# Patient Record
Sex: Female | Born: 1965 | Race: White | Hispanic: No | Marital: Married | Smoking: Never smoker
Health system: Southern US, Community
[De-identification: ages and names within clinical notes are randomized; demographics above are authoritative.]

## PROBLEM LIST (undated history)

## (undated) DIAGNOSIS — C801 Malignant (primary) neoplasm, unspecified: Secondary | ICD-10-CM

## (undated) DIAGNOSIS — D6851 Activated protein C resistance: Secondary | ICD-10-CM

## (undated) DIAGNOSIS — D689 Coagulation defect, unspecified: Secondary | ICD-10-CM

## (undated) DIAGNOSIS — T7840XA Allergy, unspecified, initial encounter: Secondary | ICD-10-CM

## (undated) DIAGNOSIS — Z86718 Personal history of other venous thrombosis and embolism: Secondary | ICD-10-CM

## (undated) HISTORY — PX: OTHER SURGICAL HISTORY: SHX169

## (undated) HISTORY — DX: Coagulation defect, unspecified: D68.9

## (undated) HISTORY — DX: Activated protein C resistance: D68.51

## (undated) HISTORY — DX: Personal history of other venous thrombosis and embolism: Z86.718

## (undated) HISTORY — DX: Allergy, unspecified, initial encounter: T78.40XA

## (undated) HISTORY — PX: COLONOSCOPY: SHX174

## (undated) HISTORY — DX: Malignant (primary) neoplasm, unspecified: C80.1

## (undated) HISTORY — PX: BREAST CYST ASPIRATION: SHX578

---

## 1998-11-09 ENCOUNTER — Encounter: Payer: Self-pay | Admitting: Obstetrics and Gynecology

## 1998-11-09 ENCOUNTER — Ambulatory Visit (HOSPITAL_COMMUNITY): Admission: RE | Admit: 1998-11-09 | Discharge: 1998-11-09 | Payer: Self-pay | Admitting: Obstetrics and Gynecology

## 2000-01-11 ENCOUNTER — Encounter: Payer: Self-pay | Admitting: Obstetrics and Gynecology

## 2000-01-11 ENCOUNTER — Ambulatory Visit (HOSPITAL_COMMUNITY): Admission: RE | Admit: 2000-01-11 | Discharge: 2000-01-11 | Payer: Self-pay | Admitting: Obstetrics and Gynecology

## 2000-02-14 ENCOUNTER — Other Ambulatory Visit: Admission: RE | Admit: 2000-02-14 | Discharge: 2000-02-14 | Payer: Self-pay | Admitting: Obstetrics and Gynecology

## 2001-02-13 ENCOUNTER — Other Ambulatory Visit: Admission: RE | Admit: 2001-02-13 | Discharge: 2001-02-13 | Payer: Self-pay | Admitting: Obstetrics and Gynecology

## 2001-02-20 ENCOUNTER — Encounter: Admission: RE | Admit: 2001-02-20 | Discharge: 2001-02-20 | Payer: Self-pay | Admitting: Obstetrics and Gynecology

## 2001-02-20 ENCOUNTER — Encounter: Payer: Self-pay | Admitting: Obstetrics and Gynecology

## 2001-07-17 ENCOUNTER — Other Ambulatory Visit: Admission: RE | Admit: 2001-07-17 | Discharge: 2001-07-17 | Payer: Self-pay | Admitting: Obstetrics and Gynecology

## 2004-06-01 ENCOUNTER — Encounter: Admission: RE | Admit: 2004-06-01 | Discharge: 2004-06-01 | Payer: Self-pay | Admitting: Obstetrics and Gynecology

## 2004-12-12 ENCOUNTER — Ambulatory Visit: Payer: Self-pay | Admitting: Internal Medicine

## 2005-09-10 ENCOUNTER — Ambulatory Visit: Payer: Self-pay | Admitting: Internal Medicine

## 2005-09-14 ENCOUNTER — Ambulatory Visit: Admission: RE | Admit: 2005-09-14 | Discharge: 2005-09-14 | Payer: Self-pay | Admitting: Internal Medicine

## 2005-09-24 ENCOUNTER — Ambulatory Visit: Payer: Self-pay | Admitting: Internal Medicine

## 2006-06-04 ENCOUNTER — Encounter: Admission: RE | Admit: 2006-06-04 | Discharge: 2006-06-04 | Payer: Self-pay | Admitting: Obstetrics and Gynecology

## 2006-06-27 ENCOUNTER — Encounter: Admission: RE | Admit: 2006-06-27 | Discharge: 2006-06-27 | Payer: Self-pay | Admitting: Obstetrics and Gynecology

## 2007-03-20 ENCOUNTER — Ambulatory Visit: Payer: Self-pay | Admitting: Internal Medicine

## 2007-03-20 LAB — CONVERTED CEMR LAB
ALT: 12 units/L (ref 0–40)
Albumin: 3.8 g/dL (ref 3.5–5.2)
Alkaline Phosphatase: 59 units/L (ref 39–117)
Basophils Relative: 0.2 % (ref 0.0–1.0)
Bilirubin Urine: NEGATIVE
Calcium: 8.8 mg/dL (ref 8.4–10.5)
Chloride: 109 meq/L (ref 96–112)
Cholesterol: 135 mg/dL (ref 0–200)
Creatinine, Ser: 0.7 mg/dL (ref 0.4–1.2)
Eosinophils Relative: 1.7 % (ref 0.0–5.0)
GFR calc Af Amer: 119 mL/min
GFR calc non Af Amer: 98 mL/min
Glucose, Bld: 104 mg/dL — ABNORMAL HIGH (ref 70–99)
HCT: 38.1 % (ref 36.0–46.0)
Hemoglobin, Urine: NEGATIVE
Hemoglobin: 13 g/dL (ref 12.0–15.0)
Lymphocytes Relative: 23.8 % (ref 12.0–46.0)
Neutro Abs: 3.7 10*3/uL (ref 1.4–7.7)
Neutrophils Relative %: 66.8 % (ref 43.0–77.0)
Nitrite: NEGATIVE
RDW: 12.1 % (ref 11.5–14.6)
Specific Gravity, Urine: 1.025 (ref 1.000–1.03)
Urine Glucose: NEGATIVE mg/dL

## 2007-03-28 ENCOUNTER — Ambulatory Visit: Payer: Self-pay | Admitting: Internal Medicine

## 2007-05-13 ENCOUNTER — Ambulatory Visit: Payer: Self-pay | Admitting: Internal Medicine

## 2007-06-06 ENCOUNTER — Encounter: Admission: RE | Admit: 2007-06-06 | Discharge: 2007-06-06 | Payer: Self-pay | Admitting: Obstetrics and Gynecology

## 2007-06-12 ENCOUNTER — Encounter: Admission: RE | Admit: 2007-06-12 | Discharge: 2007-06-12 | Payer: Self-pay | Admitting: Obstetrics and Gynecology

## 2007-08-09 ENCOUNTER — Encounter: Payer: Self-pay | Admitting: Internal Medicine

## 2007-08-09 DIAGNOSIS — D6859 Other primary thrombophilia: Secondary | ICD-10-CM

## 2007-08-09 DIAGNOSIS — Z86718 Personal history of other venous thrombosis and embolism: Secondary | ICD-10-CM

## 2007-08-09 DIAGNOSIS — J309 Allergic rhinitis, unspecified: Secondary | ICD-10-CM | POA: Insufficient documentation

## 2007-12-09 ENCOUNTER — Ambulatory Visit: Payer: Self-pay | Admitting: Internal Medicine

## 2007-12-09 DIAGNOSIS — J019 Acute sinusitis, unspecified: Secondary | ICD-10-CM | POA: Insufficient documentation

## 2008-01-14 ENCOUNTER — Ambulatory Visit: Payer: Self-pay | Admitting: Internal Medicine

## 2008-01-14 DIAGNOSIS — J329 Chronic sinusitis, unspecified: Secondary | ICD-10-CM | POA: Insufficient documentation

## 2008-06-07 ENCOUNTER — Encounter: Admission: RE | Admit: 2008-06-07 | Discharge: 2008-06-07 | Payer: Self-pay | Admitting: Obstetrics and Gynecology

## 2008-06-28 ENCOUNTER — Ambulatory Visit: Payer: Self-pay | Admitting: Internal Medicine

## 2008-06-28 DIAGNOSIS — F329 Major depressive disorder, single episode, unspecified: Secondary | ICD-10-CM

## 2008-06-28 DIAGNOSIS — R4589 Other symptoms and signs involving emotional state: Secondary | ICD-10-CM | POA: Insufficient documentation

## 2008-06-28 DIAGNOSIS — R7309 Other abnormal glucose: Secondary | ICD-10-CM | POA: Insufficient documentation

## 2008-06-30 LAB — CONVERTED CEMR LAB
Basophils Relative: 1.9 % (ref 0.0–3.0)
CO2: 28 meq/L (ref 19–32)
Chloride: 108 meq/L (ref 96–112)
Creatinine, Ser: 0.8 mg/dL (ref 0.4–1.2)
Eosinophils Absolute: 0.3 10*3/uL (ref 0.0–0.7)
Glucose, Bld: 118 mg/dL — ABNORMAL HIGH (ref 70–99)
Hemoglobin: 12.1 g/dL (ref 12.0–15.0)
MCHC: 34.6 g/dL (ref 30.0–36.0)
Monocytes Absolute: 0.5 10*3/uL (ref 0.1–1.0)
Monocytes Relative: 5.6 % (ref 3.0–12.0)
Neutro Abs: 6.3 10*3/uL (ref 1.4–7.7)
Neutrophils Relative %: 73.1 % (ref 43.0–77.0)
Platelets: 219 10*3/uL (ref 150–400)
RDW: 11.9 % (ref 11.5–14.6)
Sodium: 140 meq/L (ref 135–145)

## 2008-08-30 ENCOUNTER — Ambulatory Visit: Payer: Self-pay | Admitting: Internal Medicine

## 2008-12-22 ENCOUNTER — Ambulatory Visit: Payer: Self-pay | Admitting: Internal Medicine

## 2008-12-22 LAB — CONVERTED CEMR LAB
BUN: 11 mg/dL (ref 6–23)
GFR calc Af Amer: 117 mL/min
Hgb A1c MFr Bld: 5.9 % (ref 4.6–6.0)
TSH: 0.58 microintl units/mL (ref 0.35–5.50)

## 2008-12-28 ENCOUNTER — Ambulatory Visit: Payer: Self-pay | Admitting: Internal Medicine

## 2009-06-17 ENCOUNTER — Ambulatory Visit: Payer: Self-pay | Admitting: Internal Medicine

## 2009-06-17 LAB — CONVERTED CEMR LAB
AST: 15 units/L (ref 0–37)
Basophils Absolute: 0 10*3/uL (ref 0.0–0.1)
Calcium: 8.8 mg/dL (ref 8.4–10.5)
Chloride: 106 meq/L (ref 96–112)
Cholesterol: 160 mg/dL (ref 0–200)
GFR calc non Af Amer: 96.82 mL/min (ref >60)
HCT: 37.7 % (ref 36.0–46.0)
Hgb A1c MFr Bld: 5.8 % (ref 4.6–6.5)
Lymphocytes Relative: 30.8 % (ref 12.0–46.0)
MCHC: 33.9 g/dL (ref 30.0–36.0)
MCV: 96.1 fL (ref 78.0–100.0)
Monocytes Relative: 9.6 % (ref 3.0–12.0)
Neutro Abs: 2.2 10*3/uL (ref 1.4–7.7)
Neutrophils Relative %: 51.7 % (ref 43.0–77.0)
Potassium: 4.4 meq/L (ref 3.5–5.1)
RBC: 3.92 M/uL (ref 3.87–5.11)
Sodium: 138 meq/L (ref 135–145)
Total CHOL/HDL Ratio: 3
Total Protein: 6.5 g/dL (ref 6.0–8.3)
Triglycerides: 47 mg/dL (ref 0.0–149.0)
VLDL: 9.4 mg/dL (ref 0.0–40.0)

## 2009-06-20 ENCOUNTER — Encounter: Admission: RE | Admit: 2009-06-20 | Discharge: 2009-06-20 | Payer: Self-pay | Admitting: Obstetrics and Gynecology

## 2009-06-21 ENCOUNTER — Ambulatory Visit: Payer: Self-pay | Admitting: Internal Medicine

## 2009-06-28 ENCOUNTER — Encounter: Admission: RE | Admit: 2009-06-28 | Discharge: 2009-06-28 | Payer: Self-pay | Admitting: Obstetrics and Gynecology

## 2009-07-27 ENCOUNTER — Ambulatory Visit: Payer: Self-pay | Admitting: Internal Medicine

## 2009-07-27 DIAGNOSIS — R21 Rash and other nonspecific skin eruption: Secondary | ICD-10-CM | POA: Insufficient documentation

## 2009-10-14 ENCOUNTER — Ambulatory Visit: Payer: Self-pay | Admitting: Internal Medicine

## 2009-12-07 ENCOUNTER — Ambulatory Visit: Payer: Self-pay | Admitting: Internal Medicine

## 2009-12-07 DIAGNOSIS — I8000 Phlebitis and thrombophlebitis of superficial vessels of unspecified lower extremity: Secondary | ICD-10-CM | POA: Insufficient documentation

## 2009-12-07 DIAGNOSIS — D682 Hereditary deficiency of other clotting factors: Secondary | ICD-10-CM | POA: Insufficient documentation

## 2010-01-13 ENCOUNTER — Ambulatory Visit: Payer: Self-pay | Admitting: Internal Medicine

## 2010-01-13 DIAGNOSIS — J209 Acute bronchitis, unspecified: Secondary | ICD-10-CM | POA: Insufficient documentation

## 2010-02-20 ENCOUNTER — Encounter: Payer: Self-pay | Admitting: Internal Medicine

## 2010-02-20 ENCOUNTER — Ambulatory Visit: Payer: Self-pay | Admitting: Endocrinology

## 2010-02-20 DIAGNOSIS — J069 Acute upper respiratory infection, unspecified: Secondary | ICD-10-CM | POA: Insufficient documentation

## 2010-03-09 IMAGING — US US BREAST R
1 series · 5 of 5 positions shown · non-contrast
Comparison: 06/20/2009, 06/07/2008, and 06/06/2007

CLINICAL DATA: Abnormal screening mammogram with possible right
breast mass.

DIGITAL DIAGNOSTIC  RIGHT  MAMMOGRAM   AND RIGHT BREAST
ULTRASOUND:

[Series 1: us breast right · 5 of 5 slices shown]
[im 1/5]
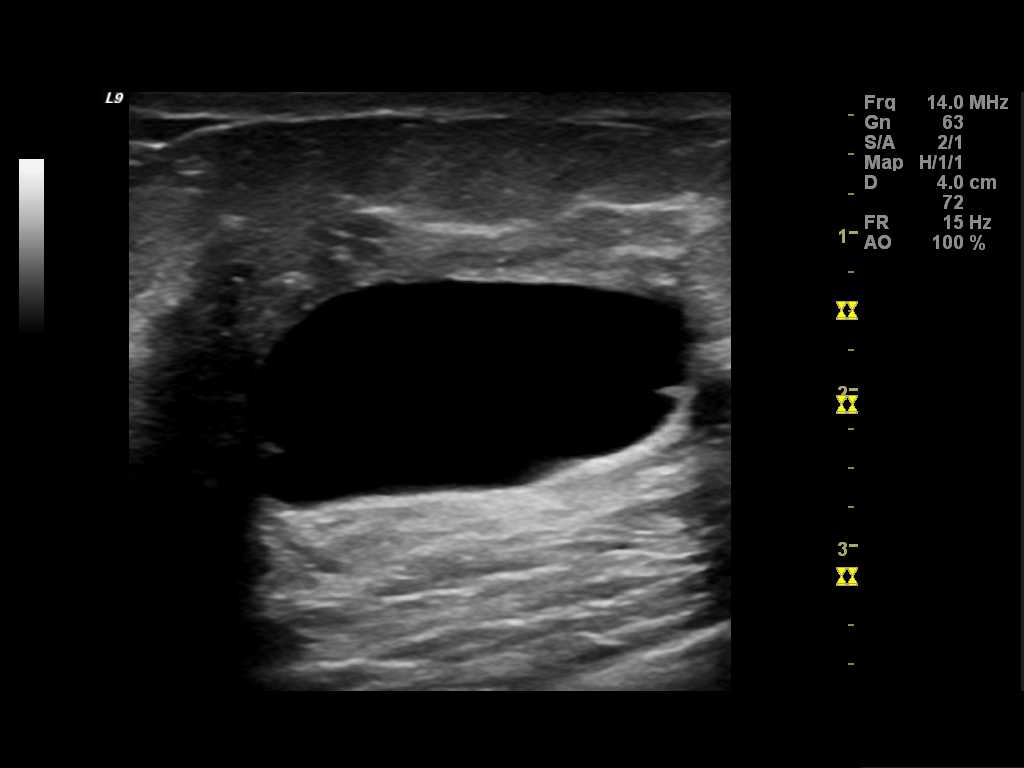
[im 2/5]
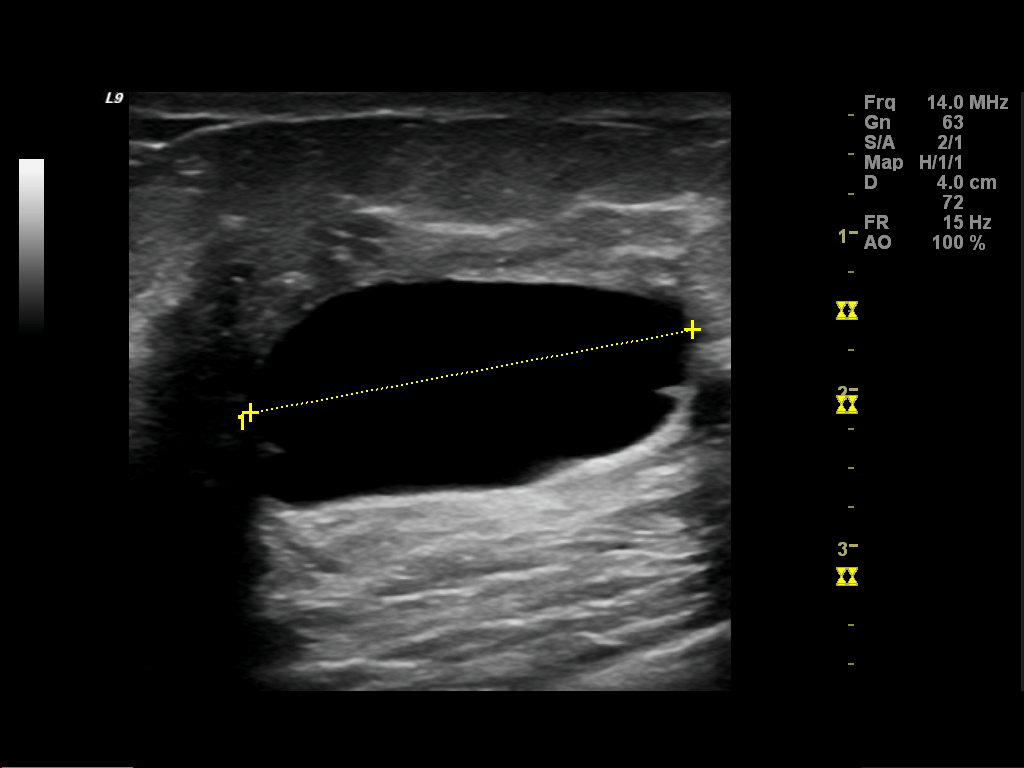
[im 3/5]
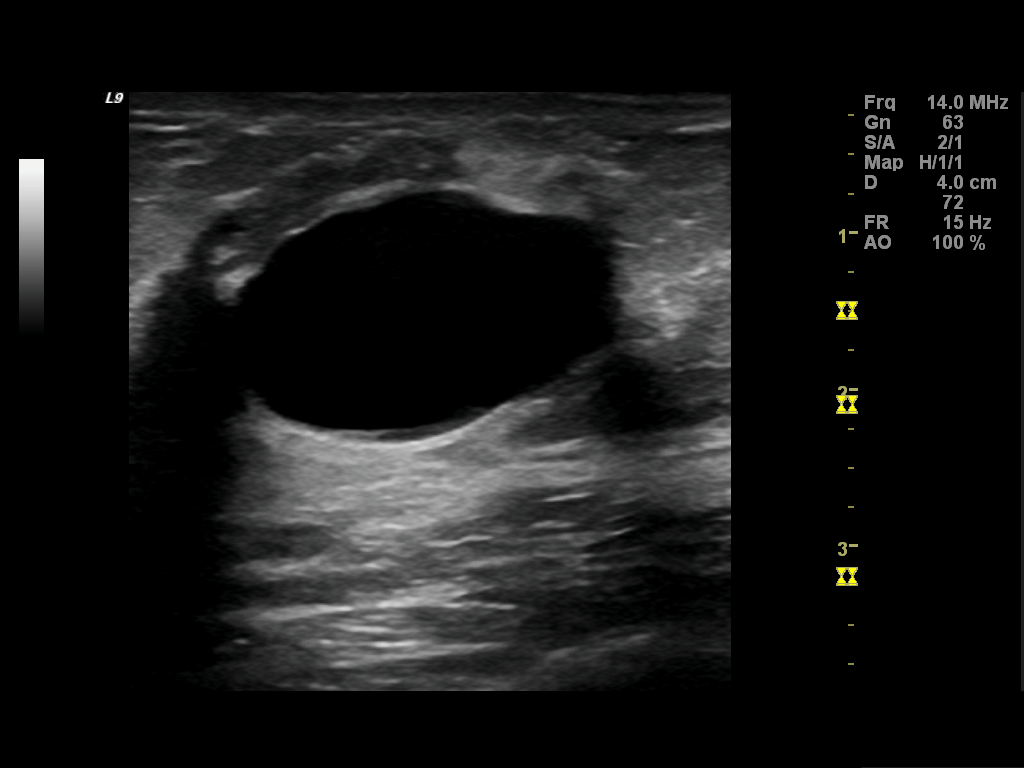
[im 4/5]
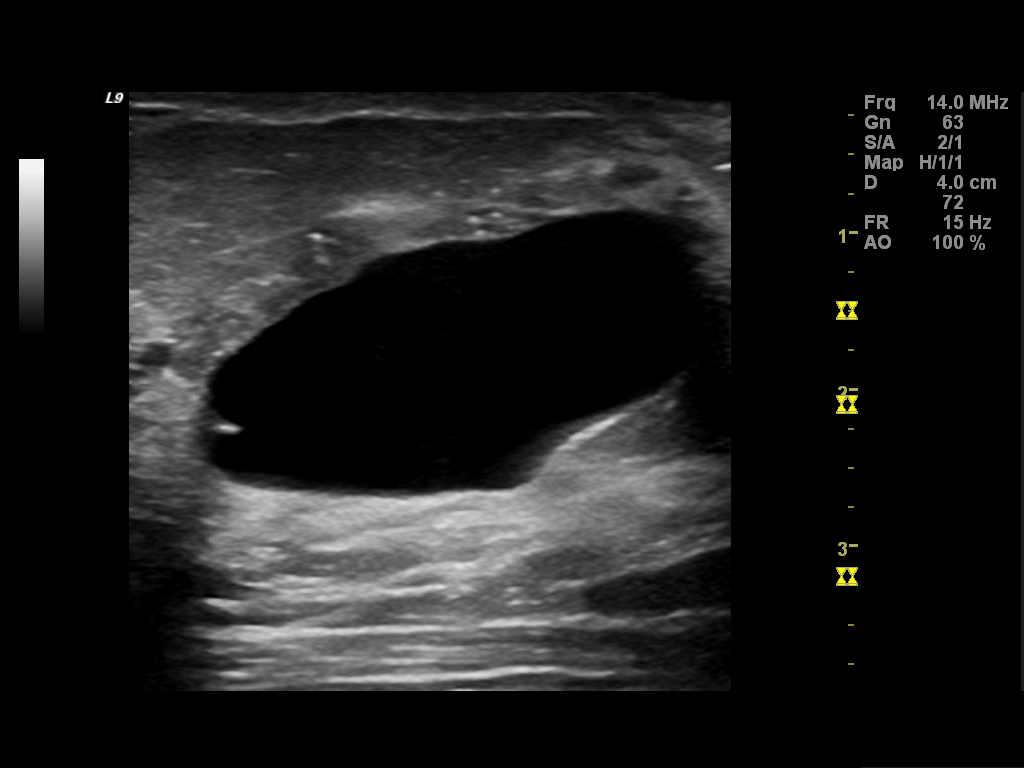
[im 5/5]
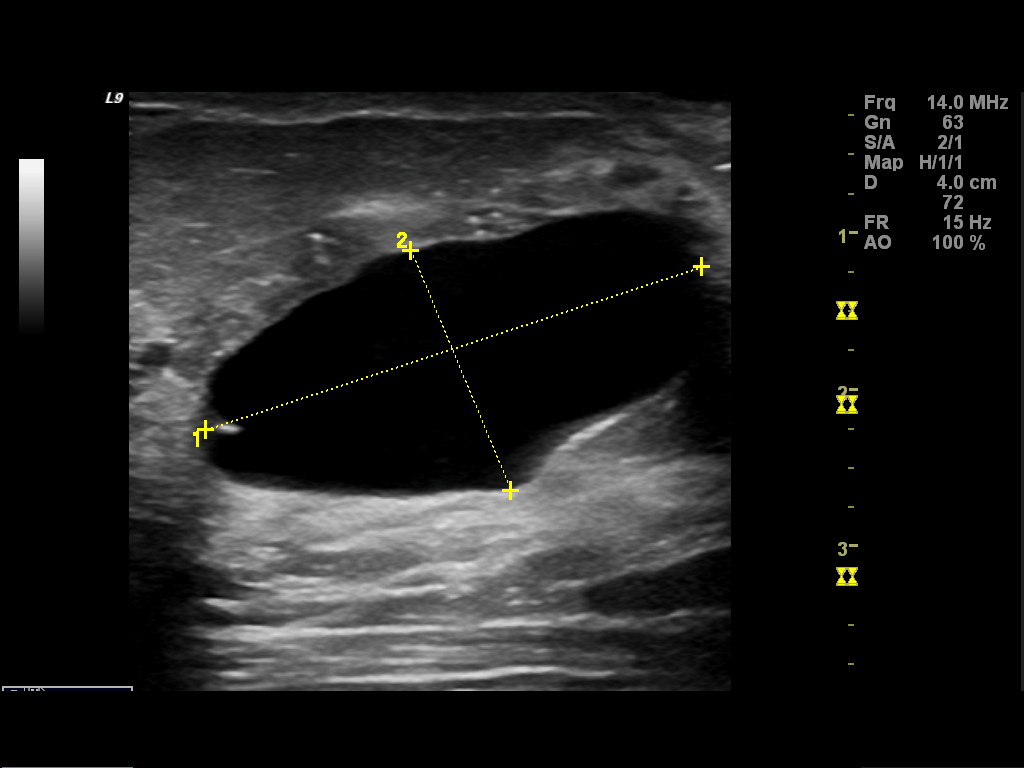

[5 of 5 positions shown; findings below may reference images not displayed]

FINDINGS: CC and MLO spot compression views of the subareolar right
breast demonstrates a circumscribed oval mass in the subareolar
region without distortion or suspicious calcifications.

On physical exam, a soft mobile mass is identified in the right
subareolar region.

Ultrasound is performed, showing a 3.3 x 1.6 x 2.8 cm simple cyst
in the right subareolar region.
IMPRESSION: Simple cyst in the right subareolar region corresponding to
screening mammogram abnormality.

These findings were discussed with the patient.  She was encouraged
to continue monthly self exams and to contact her primary physician
if any changes noted.

BI-RADS CATEGORY 2:  Benign finding(s).

Recommend bilateral screening mammograms in 1 year.

## 2010-03-16 ENCOUNTER — Ambulatory Visit: Payer: Self-pay | Admitting: Internal Medicine

## 2010-03-16 DIAGNOSIS — M25569 Pain in unspecified knee: Secondary | ICD-10-CM

## 2010-03-30 ENCOUNTER — Ambulatory Visit: Payer: Self-pay | Admitting: Internal Medicine

## 2010-06-22 ENCOUNTER — Encounter: Admission: RE | Admit: 2010-06-22 | Discharge: 2010-06-22 | Payer: Self-pay | Admitting: Obstetrics and Gynecology

## 2010-06-29 ENCOUNTER — Ambulatory Visit: Payer: Self-pay | Admitting: Internal Medicine

## 2010-12-25 ENCOUNTER — Other Ambulatory Visit: Payer: Self-pay | Admitting: Internal Medicine

## 2010-12-25 ENCOUNTER — Ambulatory Visit
Admission: RE | Admit: 2010-12-25 | Discharge: 2010-12-25 | Payer: Self-pay | Source: Home / Self Care | Attending: Internal Medicine | Admitting: Internal Medicine

## 2010-12-25 LAB — BASIC METABOLIC PANEL
BUN: 15 mg/dL (ref 6–23)
CO2: 24 mEq/L (ref 19–32)
Calcium: 8.7 mg/dL (ref 8.4–10.5)
Chloride: 103 mEq/L (ref 96–112)
Creatinine, Ser: 0.6 mg/dL (ref 0.4–1.2)
GFR: 108.58 mL/min (ref >60.00)
Glucose, Bld: 91 mg/dL (ref 70–99)
Potassium: 4.6 mEq/L (ref 3.5–5.1)
Sodium: 133 mEq/L — ABNORMAL LOW (ref 135–145)

## 2010-12-25 LAB — LIPID PANEL
Cholesterol: 160 mg/dL (ref 0–200)
HDL: 67.8 mg/dL (ref >39.00)
LDL Cholesterol: 81 mg/dL (ref 0–99)
Total CHOL/HDL Ratio: 2
Triglycerides: 54 mg/dL (ref 0.0–149.0)
VLDL: 10.8 mg/dL (ref 0.0–40.0)

## 2010-12-25 LAB — CBC WITH DIFFERENTIAL/PLATELET
Basophils Absolute: 0 10*3/uL (ref 0.0–0.1)
Basophils Relative: 0.2 % (ref 0.0–3.0)
Eosinophils Absolute: 0.3 10*3/uL (ref 0.0–0.7)
Eosinophils Relative: 6.6 % — ABNORMAL HIGH (ref 0.0–5.0)
HCT: 38.4 % (ref 36.0–46.0)
Hemoglobin: 12.9 g/dL (ref 12.0–15.0)
Lymphocytes Relative: 28.9 % (ref 12.0–46.0)
Lymphs Abs: 1.5 10*3/uL (ref 0.7–4.0)
MCHC: 33.5 g/dL (ref 30.0–36.0)
MCV: 96.2 fl (ref 78.0–100.0)
Monocytes Absolute: 0.5 10*3/uL (ref 0.1–1.0)
Monocytes Relative: 9.4 % (ref 3.0–12.0)
Neutro Abs: 2.8 10*3/uL (ref 1.4–7.7)
Neutrophils Relative %: 54.9 % (ref 43.0–77.0)
Platelets: 233 10*3/uL (ref 150.0–400.0)
RBC: 3.99 Mil/uL (ref 3.87–5.11)
RDW: 13 % (ref 11.5–14.6)
WBC: 5.1 10*3/uL (ref 4.5–10.5)

## 2010-12-25 LAB — HEPATIC FUNCTION PANEL
ALT: 13 U/L (ref 0–35)
AST: 13 U/L (ref 0–37)
Albumin: 3.8 g/dL (ref 3.5–5.2)
Alkaline Phosphatase: 60 U/L (ref 39–117)
Bilirubin, Direct: 0.1 mg/dL (ref 0.0–0.3)
Total Bilirubin: 0.7 mg/dL (ref 0.3–1.2)
Total Protein: 6.3 g/dL (ref 6.0–8.3)

## 2010-12-25 LAB — URINALYSIS
Bilirubin Urine: NEGATIVE
Hemoglobin, Urine: NEGATIVE
Ketones, ur: NEGATIVE
Leukocytes, UA: NEGATIVE
Nitrite: NEGATIVE
Specific Gravity, Urine: 1.025 (ref 1.000–1.030)
Total Protein, Urine: NEGATIVE
Urine Glucose: NEGATIVE
Urobilinogen, UA: 0.2 (ref 0.0–1.0)
pH: 5.5 (ref 5.0–8.0)

## 2010-12-25 LAB — TSH: TSH: 2 u[IU]/mL (ref 0.35–5.50)

## 2011-01-02 ENCOUNTER — Ambulatory Visit
Admission: RE | Admit: 2011-01-02 | Discharge: 2011-01-02 | Payer: Self-pay | Source: Home / Self Care | Attending: Internal Medicine | Admitting: Internal Medicine

## 2011-01-02 ENCOUNTER — Encounter: Payer: Self-pay | Admitting: Internal Medicine

## 2011-01-11 NOTE — Assessment & Plan Note (Signed)
Summary: FELL/ TWISTED KNEE/AVP'S PT/NWS   Vital Signs:  Patient profile:   45 year old female Height:      64 inches Weight:      155 pounds BMI:     26.70 O2 Sat:      97 % on Room air Temp:     97.5 degrees F oral Pulse rate:   85 / minute BP sitting:   110 / 70  (left arm) Cuff size:   regular  Vitals Entered ByZella Ball Ewing (March 16, 2010 4:26 PM)  O2 Flow:  Room air CC: Fell twisted right knee/RE   CC:  Fell twisted right knee/RE.  History of Present Illness: here after falling off of a short stool at work to the right , fell about 1-2 ft reaching for an object overhead, fell to the right without injury or contusion to anything else  but realized at the time her right knee was twisted but minimal pain at the time it happened this am;  later during the day has exeperienced relatively localized pain to the prox patellar region and medial knee, as well a localized swelling to the medial knee as well with a sense of tightness;    no other sweling or pain or bruising noted. No fever;  pain approx 5/10, sharp, localized, without click or catch to the knee;  no prior hx of knee injury or surgury in the past.    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 1 By Mouth Daily 2)  Fluoxetine Hcl 10 Mg  Caps (Fluoxetine Hcl) .... Take 1 Capsule By Mouth Daily 3)   Lorazepam 1 Mg  Tabs (Lorazepam) .... 1/2 - 1 By Mouth Bid Prn 4)  Zyrtec Hives Relief 10 Mg Tabs (Cetirizine Hcl) .... As Needed 5)  Tessalon Perles 100 Mg Caps (Benzonatate) .Marland Kitchen.. 1-2 By Mouth Two Times A Day As Needed Cogh 6)  Promethazine-Codeine 6.25-10 Mg/95ml Syrp (Promethazine-Codeine) .... 5-10 Ml By Mouth Q Id As Needed Cough 7)  Cefuroxime Axetil 250 Mg Tabs (Cefuroxime Axetil) .Marland Kitchen.. 1 Two Times A Day  Current Medications (verified): 1)  Vitamin D3 1000 Unit  Tabs (Cholecalciferol) .Marland Kitchen.. 1 By Mouth Daily 2)  Fluoxetine Hcl 10 Mg  Caps (Fluoxetine Hcl) .... Take 1 Capsule By Mouth Daily 3)  Lorazepam 1 Mg  Tabs (Lorazepam) .... 1/2 - 1 By Mouth Bid Prn 4)  Zyrtec Hives Relief 10 Mg Tabs (Cetirizine Hcl) .... As Needed 5)  Naproxen 500 Mg Tabs (Naproxen) .Marland Kitchen.. 1po Two Times A Day As Needed  Allergies (verified): No Known Drug Allergies  Past History:  Past Medical History: Last updated: 01/14/2008 Allergic rhinitis DVT, hx of History of factor V leyden mutation/hypercoagulable state  Past Surgical History: Last updated: 12/09/2007 Denies surgical history  Social History: Last updated: 12/09/2007 Never Smoked Alcohol use-yes  Risk Factors: Smoking Status: never (01/13/2010)  Review of Systems       all otherwise negative per pt -    Physical Exam  General:  alert and well-developed.   Head:  normocephalic and atraumatic.   Eyes:  vision grossly intact, pupils equal, and pupils round.   Ears:  R ear normal and L ear normal.   Nose:  no external deformity and no nasal discharge.   Mouth:  no gingival abnormalities and pharynx pink and moist.   Neck:  supple and no masses.   Lungs:  normal respiratory effort and normal breath sounds.   Heart:  normal rate and regular rhythm.   Msk:  right knee with medial swelling and tenderness over the joint line; has FROM and o/w nontender and nonswollen,  no click , catch or crepitus noted o/w normal appearing knee Extremities:   no edema, no erythema  Neurologic:  strength normal in all extremities.     Impression & Recommendations:  Problem # 1:  KNEE PAIN, RIGHT, ACUTE (ICD-719.46)  c/w med collateral ligement strain most likely - for ice three times a day 15 min, 2 days only;  for film today to r/o unusual fx, nsaid as needed, f/u as needed , consdier ortho if pesists or worsens   Orders: T-Knee Right 2 view (73560TC)  Her updated medication list for this problem includes:    Naproxen 500 Mg Tabs (Naproxen) .Marland Kitchen... 1po two times a day as needed  Complete Medication List: 1)  Vitamin D3 1000 Unit Tabs (Cholecalciferol) .Marland Kitchen.. 1 by mouth daily 2)  Fluoxetine Hcl 10 Mg Caps (Fluoxetine hcl) .... Take 1 capsule by mouth daily 3)  Lorazepam 1 Mg Tabs (Lorazepam) .... 1/2 - 1 by mouth bid prn 4)  Zyrtec Hives Relief 10 Mg Tabs (Cetirizine hcl) .... As needed 5)  Naproxen 500 Mg Tabs (Naproxen) .Marland Kitchen.. 1po two times a day as needed  Patient Instructions: 1)  Please take all new medications as prescribed 2)  Continue all previous medications as before this visit  3)  Please go to Radiology in the basement level for your X-Ray today  4)  Please schedule an appointment with your primary doctor  as needed Prescriptions: NAPROXEN 500 MG TABS (NAPROXEN) 1po two times a day as needed  #60 x 0   Entered and Authorized by:   Corwin Levins MD   Signed by:   Corwin Levins MD on 03/16/2010   Method used:   Print then Give to Patient   RxID:   [id number]

## 2011-01-11 NOTE — Assessment & Plan Note (Signed)
Summary: SINUS INFECTION/SEVERE COUGH/PLOT/LB   Vital Signs:  Patient profile:   45 year old female Height:      64 inches (162.56 cm) Weight:      155.50 pounds (70.68 kg) O2 Sat:      98 % on Room air Temp:     97.1 degrees F (36.17 degrees C) oral Pulse rate:   85 / minute BP sitting:   110 / 68  (left arm) Cuff size:   regular  Vitals Entered By: Josph Macho RMA (February 20, 2010 3:39 PM)  O2 Flow:  Room air CC: Sinus infection, cough X5 days/ pt states she is no longer taking Tessalon or Promethazine/ CF Is Patient Diabetic? No   CC:  Sinus infection and cough X5 days/ pt states she is no longer taking Tessalon or Promethazine/ CF.  History of Present Illness: pt states 4 days of nasal congestion, hoarseness, and dry cough.  Current Medications (verified): 1)  Vitamin D3 1000 Unit  Tabs (Cholecalciferol) .Marland Kitchen.. 1 By Mouth Daily 2)  Fluoxetine Hcl 10 Mg  Caps (Fluoxetine Hcl) .... Take 1 Capsule By Mouth Daily 3)  Lorazepam 1 Mg  Tabs (Lorazepam) .... 1/2 - 1 By Mouth Bid Prn 4)  Zyrtec Hives Relief 10 Mg Tabs (Cetirizine Hcl) .... As Needed 5)  Tessalon Perles 100 Mg Caps (Benzonatate) .Marland Kitchen.. 1-2 By Mouth Two Times A Day As Needed Cogh 6)  Promethazine-Codeine 6.25-10 Mg/70ml Syrp (Promethazine-Codeine) .... 5-10 Ml By Mouth Q Id As Needed Cough 7)  Diflucan 150 Mg Tabs (Fluconazole) .Marland Kitchen.. 1 By Mouth Once Daily Once For Yeast Infection  Allergies (verified): No Known Drug Allergies  Past History:  Past Medical History: Last updated: 01/14/2008 Allergic rhinitis DVT, hx of History of factor V leyden mutation/hypercoagulable state  Review of Systems  The patient denies fever.         no earache  Physical Exam  General:  no distress  Head:  head: no deformity eyes: no periorbital swelling, no proptosis external nose and ears are normal mouth: no lesion seen Neck:  Supple without thyroid enlargement or tenderness. No cervical lymphadenopathy, neck masses or  tracheal deviation.  Lungs:  Clear to auscultation bilaterally. Normal respiratory effort.    Impression & Recommendations:  Problem # 1:  URI (ICD-465.9) Assessment New  Medications Added to Medication List This Visit: 1)  Cefuroxime Axetil 250 Mg Tabs (Cefuroxime axetil) .Marland Kitchen.. 1 two times a day  Other Orders: Est. Patient Level III ([address])  Patient Instructions: 1)  cefuroxime 250 mg two times a day 2)  promethazine-codeine syrup 1 teaspoon every 4 prn as needed for cough.  you can take from your prescription from last month. 3)  loratadine-d (non-prescription) as needed for congestion. Prescriptions: CEFUROXIME AXETIL 250 MG TABS (CEFUROXIME AXETIL) 1 two times a day  #14 x 0   Entered and Authorized by:   Minus Breeding MD   Signed by:   Minus Breeding MD on 02/20/2010   Method used:   [facility]        [facility] [phone][facility] (retail)       [address]       [place], [place]  [address]       Ph: [phone]       Fax: [phone]   RxID:   [id number]

## 2011-01-11 NOTE — Assessment & Plan Note (Signed)
Summary: CPX /NWS  #   Vital Signs:  Patient profile:   45 year old female Height:      64 inches Weight:      156 pounds BMI:     26.87 Temp:     98.0 degrees F oral Pulse rate:   80 / minute Pulse rhythm:   regular Resp:     16 per minute BP sitting:   116 / 70  (left arm) Cuff size:   regular  Vitals Entered By: Lanier Prude, CMA(AAMA) (2011/01/21 8:23 AM) CC: CPX Is Patient Diabetic? No Comments pt is not taking Lorazepam or Naproxen   CC:  CPX.  History of Present Illness: The patient presents for a preventive health examination  C/o L leg lump  Preventive Screening-Counseling & Management  Caffeine-Diet-Exercise     Does Patient Exercise: no  Current Medications (verified): 1)  Vitamin D3 1000 Unit  Tabs (Cholecalciferol) .Marland Kitchen.. 1 By Mouth Daily 2)  Lorazepam 1 Mg  Tabs (Lorazepam) .... 1/2 - 1 By Mouth Bid Prn 3)  Zyrtec Hives Relief 10 Mg Tabs (Cetirizine Hcl) .... As Needed 4)  Naproxen 500 Mg Tabs (Naproxen) .Marland Kitchen.. 1po Two Times A Day As Needed 5)  Pennsaid 1.5 % Soln (Diclofenac Sodium) .... 3-5 Gtt On Skin Three Times A Day For Pain  Allergies (verified): No Known Drug Allergies  Past History:  Past Surgical History: Last updated: 12/09/2007 Denies surgical history  Family History: Last updated: 01-21-2011 breast cancer - sister died in 05/09/2009 mother with leukemia father with DM, stroke, MI  Social History: Last updated: 01/21/2011 Never Smoked Alcohol use-yes Regular exercise-no 4 cats and 1 dog Married, no children  Past Medical History: Reviewed history from 01/14/2008 and no changes required. Allergic rhinitis DVT, hx of History of factor V leyden mutation/hypercoagulable state  Family History: breast cancer - sister died in 05-09-2009 mother with leukemia father with DM, stroke, MI  Social History: Never Smoked Alcohol use-yes Regular exercise-no 4 cats and 1 dog Married, no children Does Patient Exercise:  no  Review of  Systems       The patient complains of weight gain.  The patient denies anorexia, fever, weight loss, vision loss, decreased hearing, hoarseness, chest pain, syncope, dyspnea on exertion, peripheral edema, prolonged cough, headaches, hemoptysis, abdominal pain, melena, hematochezia, severe indigestion/heartburn, hematuria, incontinence, genital sores, muscle weakness, suspicious skin lesions, transient blindness, difficulty walking, depression, unusual weight change, abnormal bleeding, enlarged lymph nodes, angioedema, and breast masses.    Physical Exam  General:  alert and well-developed.   Head:  normocephalic and atraumatic.   Eyes:  No corneal or conjunctival inflammation noted. EOMI. Perrla. Ears:  R ear normal and L ear normal.   Nose:  no external deformity and no nasal discharge.   Mouth:  no gingival abnormalities and pharynx pink and moist.   Neck:  supple and no masses.   Lungs:  normal respiratory effort and normal breath sounds.   Heart:  normal rate and regular rhythm.   Abdomen:  Bowel sounds positive,abdomen soft and non-tender without masses, organomegaly or hernias noted. Msk:  WNL Pulses:  R and L carotid,radial,femoral,dorsalis pedis and posterior tibial pulses are full and equal bilaterally Extremities:  No clubbing, cyanosis, edema, or deformity noted with normal full range of motion of all joints.   L midial dist shin with tender subcut. lump 2x3 cm Neurologic:  strength normal in all extremities.   Skin:  clear Cervical Nodes:  No lymphadenopathy  noted Inguinal Nodes:  No significant adenopathy Psych:  Cognition and judgment appear intact. Alert and cooperative with normal attention span and concentration. No apparent delusions, illusions, hallucinations. Not depressed   Impression & Recommendations:  Problem # 1:  Preventive Health Care (ICD-V70.0) Assessment New Health and age related issues were discussed. Available screening tests and vaccinations were  discussed as well. Healthy life style including good diet and exercise was discussed.  The labs were reviewed with the patient.  GYN q 12 months   Problem # 2:  PHLEBITIS, SUPERFICIAL LEG VEINS (ICD-451.0) L med dist shin Assessment: New Naproxen - see meds  Problem # 3:  UPPER RESPIRATORY INFECTION, ACUTE (ICD-465.9) Assessment: New  Her updated medication list for this problem includes:    Zyrtec Hives Relief 10 Mg Tabs (Cetirizine hcl) .Marland Kitchen... As needed    Naproxen 500 Mg Tabs (Naproxen) .Marland Kitchen... 1po two times a day as needed Amox if worse  Complete Medication List: 1)  Vitamin D3 1000 Unit Tabs (Cholecalciferol) .Marland Kitchen.. 1 by mouth daily 2)  Lorazepam 1 Mg Tabs (Lorazepam) .... 1/2 - 1 by mouth bid prn 3)  Zyrtec Hives Relief 10 Mg Tabs (Cetirizine hcl) .... As needed 4)  Naproxen 500 Mg Tabs (Naproxen) .Marland Kitchen.. 1po two times a day as needed 5)  Pennsaid 1.5 % Soln (Diclofenac sodium) .... 3-5 gtt on skin three times a day for pain 6)  Amoxicillin 500 Mg Caps (Amoxicillin) .... 2 caps by mouth bid  Other Orders: EKG w/ Interpretation (93000)  Patient Instructions: 1)  Use over-the-counter medicines for "cold": Tylenol  650mg  or Advil 400mg  every 6 hours  for fever; Delsym or Robutussin for cough. Mucinex or Mucinex D for congestion. Ricola or Halls for sore throat. Office visit if not better or if worse.  2)  Please schedule a follow-up appointment in 1 year well w/labs. Prescriptions: AMOXICILLIN 500 MG CAPS (AMOXICILLIN) 2 caps by mouth bid  #40 x 0   Entered and Authorized by:   Tresa Garter MD   Signed by:   Tresa Garter MD on 01/02/2011   Method used:   [facility] to        [facility] [phone][facility] (retail)       [address]       [place], [place]  [address]       Ph: [phone]       Fax: [phone]   RxID:   [id number]    Orders Added: 1)  EKG w/ Interpretation [93000] 2)  Est. Patient age 65-64  [87]     Preventive Care Screening  Mammogram:    Date:  06/09/2010    Results:  normal   Pap Smear:    Date:  06/09/2010    Results:  normal

## 2011-01-11 NOTE — Miscellaneous (Signed)
Summary: [facility] [facility] [facility]  [employer] [facility]   Imported By: Lester [place] 02/23/2010 07:45:49  _____________________________________________________________________  External Attachment:    Type:   Image     Comment:   External Document

## 2011-01-11 NOTE — Assessment & Plan Note (Signed)
Summary: FU---STC   Vital Signs:  Patient profile:   45 year old female Height:      64 inches Weight:      154.50 pounds BMI:     26.62 O2 Sat:      98 % on Room air Temp:     98.2 degrees F oral Pulse rate:   82 / minute BP sitting:   94 / 68  (left arm) Cuff size:   regular  Vitals Entered By: Lucious Groves (March 30, 2010 10:22 AM)  O2 Flow:  Room air CC: Rtn ov--F/U./kb Is Patient Diabetic? No Pain Assessment Patient in pain? no        CC:  Rtn ov--F/U./kb.  History of Present Illness: C/o R knee pain after she twisted it on 4/7. Better now. Knee does not "lock".  Current Medications (verified): 1)  Vitamin D3 1000 Unit  Tabs (Cholecalciferol) .Marland Kitchen.. 1 By Mouth Daily 2)  Fluoxetine Hcl 10 Mg  Caps (Fluoxetine Hcl) .... Take 1 Capsule By Mouth Daily 3)  Lorazepam 1 Mg  Tabs (Lorazepam) .... 1/2 - 1 By Mouth Bid Prn 4)  Zyrtec Hives Relief 10 Mg Tabs (Cetirizine Hcl) .... As Needed 5)  Naproxen 500 Mg Tabs (Naproxen) .Marland Kitchen.. 1po Two Times A Day As Needed  Allergies (verified): No Known Drug Allergies  Physical Exam  General:  alert and well-developed.   Msk:  right knee with residual  medial swelling and tenderness over the distal joint line; has FROM and o/w nontender and nonswollen,  no click , catch or crepitus noted o/w normal appearing knee Extremities:  no edema, no erythema  Neurologic:  strength normal in all extremities.   Skin:  bruised R medial knee Psych:  Cognition and judgment appear intact. Alert and cooperative with normal attention span and concentration. No apparent delusions, illusions, hallucinations   Impression & Recommendations:  Problem # 1:  KNEE PAIN, RIGHT, ACUTE (ICD-719.46) likely a medial knee sprain and a meniscal bruise Assessment Improved Pennsaid three times a day Ice Her updated medication list for this problem includes:    Naproxen 500 Mg Tabs (Naproxen) .Marland Kitchen... 1po two times a day as needed hold due to stomach pain  Complete  Medication List: 1)  Vitamin D3 1000 Unit Tabs (Cholecalciferol) .Marland Kitchen.. 1 by mouth daily 2)  Fluoxetine Hcl 10 Mg Caps (Fluoxetine hcl) .... Take 1 capsule by mouth daily 3)  Lorazepam 1 Mg Tabs (Lorazepam) .... 1/2 - 1 by mouth bid prn 4)  Zyrtec Hives Relief 10 Mg Tabs (Cetirizine hcl) .... As needed 5)  Naproxen 500 Mg Tabs (Naproxen) .Marland Kitchen.. 1po two times a day as needed 6)  Pennsaid 1.5 % Soln (Diclofenac sodium) .... 3-5 gtt on skin three times a day for pain  Patient Instructions: 1)  Call if you are not better in a reasonable amount of time or if worse.  Prescriptions: PENNSAID 1.5 % SOLN (DICLOFENAC SODIUM) 3-5 gtt on skin three times a day for pain  #1 x 3   Entered and Authorized by:   Tresa Garter MD   Signed by:   Tresa Garter MD on 03/30/2010   Method used:   Print then Give to Patient   RxID:   [id number]

## 2011-01-11 NOTE — Assessment & Plan Note (Signed)
Summary: COUGH/ FEVER / ACHES /NWS   Vital Signs:  Patient profile:   45 year old female Weight:      154 pounds Temp:     98.6 degrees F oral Pulse rate:   78 / minute BP sitting:   100 / 62  (left arm)  Vitals Entered By: Tora Perches (January 13, 2010 11:13 AM) CC: cough, fever, and aches Is Patient Diabetic? No   CC:  cough, fever, and and aches.  History of Present Illness: The patient presents with complaints of sore throat, fever, cough, sinus congestion and drainge of several days duration. Not better with OTC meds. Can't sleep due to cough. Muscle aches are present.  The mucus is colored.   Preventive Screening-Counseling & Management  Alcohol-Tobacco     Smoking Status: never  Current Medications (verified): 1)  Vitamin D3 1000 Unit  Tabs (Cholecalciferol) .Marland Kitchen.. 1 By Mouth Daily 2)  Fluoxetine Hcl 10 Mg  Caps (Fluoxetine Hcl) .... Take 1 Capsule By Mouth Daily 3)  Lorazepam 1 Mg  Tabs (Lorazepam) .... 1/2 - 1 By Mouth Bid Prn 4)  Zyrtec Hives Relief 10 Mg Tabs (Cetirizine Hcl) .... As Needed  Allergies (verified): No Known Drug Allergies  Physical Exam  General:  alert and well-developed.   Mouth:  Erythematous throat mucosa and intranasal erythema.  Lungs:  CTA Heart:  RRR Abdomen:  Bowel sounds positive,abdomen soft and non-tender without masses, organomegaly or hernias noted.   Impression & Recommendations:  Problem # 1:  BRONCHITIS, ACUTE (ICD-466.0) Assessment New  Her updated medication list for this problem includes:    Zithromax Z-pak 250 Mg Tabs (Azithromycin) .Marland Kitchen... As dirrected    Tessalon Perles 100 Mg Caps (Benzonatate) .Marland Kitchen... 1-2 by mouth two times a day as needed cogh    Promethazine-codeine 6.25-10 Mg/49ml Syrp (Promethazine-codeine) .Marland Kitchen... 5-10 ml by mouth q id as needed cough  Complete Medication List: 1)  Vitamin D3 1000 Unit Tabs (Cholecalciferol) .Marland Kitchen.. 1 by mouth daily 2)  Fluoxetine Hcl 10 Mg Caps (Fluoxetine hcl) .... Take 1  capsule by mouth daily 3)  Lorazepam 1 Mg Tabs (Lorazepam) .... 1/2 - 1 by mouth bid prn 4)  Zyrtec Hives Relief 10 Mg Tabs (Cetirizine hcl) .... As needed 5)  Zithromax Z-pak 250 Mg Tabs (Azithromycin) .... As dirrected 6)  Tessalon Perles 100 Mg Caps (Benzonatate) .Marland Kitchen.. 1-2 by mouth two times a day as needed cogh 7)  Promethazine-codeine 6.25-10 Mg/30ml Syrp (Promethazine-codeine) .... 5-10 ml by mouth q id as needed cough 8)  Diflucan 150 Mg Tabs (Fluconazole) .Marland Kitchen.. 1 by mouth once daily once for yeast infection  Patient Instructions: 1)  Use over-the-counter medicines for "cold": Tylenol  650mg  or Advil 400mg  every 6 hours  for fever; Delsym or Robutussin for cough. Mucinex or Mucinex D for congestion. Ricola or Halls for sore throat. Office visit if not better or if worse.  Prescriptions: DIFLUCAN 150 MG TABS (FLUCONAZOLE) 1 by mouth once daily once for yeast infection  #1 x 1   Entered and Authorized by:   Tresa Garter MD   Signed by:   Tresa Garter MD on 01/13/2010   Method used:   Print then Give to Patient   RxID:   [id number] PROMETHAZINE-CODEINE 6.25-10 MG/5ML SYRP (PROMETHAZINE-CODEINE) 5-10 ml by mouth q id as needed cough  #300 ml x 0   Entered and Authorized by:   Tresa Garter MD   Signed by:   Tresa Garter MD  on 01/13/2010   Method used:   Print then Give to Patient   RxID:   [id number] TESSALON PERLES 100 MG CAPS (BENZONATATE) 1-2 by mouth two times a day as needed cogh  #120 x 1   Entered and Authorized by:   Tresa Garter MD   Signed by:   Tresa Garter MD on 01/13/2010   Method used:   [facility]ically to        [facility] [phone]* (retail)       [address]       [place], [place]  [address]       Ph: [phone]       Fax: [phone]   RxID:   [id number] Christena Deem Z-PAK 250 MG TABS (AZITHROMYCIN) as dirrected  #1 x 0   Entered and Authorized by:   Tresa Garter MD    Signed by:   Tresa Garter MD on 01/13/2010   Method used:   Electronically to        [facility] [phone]* (retail)       [address]       [place], [place]  [address]       Ph: [phone]       Fax: [phone]   RxID:   [id number]

## 2011-01-11 NOTE — Assessment & Plan Note (Signed)
Summary: 6 MO ROV /NWS $50   Vital Signs:  Patient profile:   45 year old female Height:      64 inches Weight:      154 pounds BMI:     26.53 O2 Sat:      98 % on Room air Temp:     98.9 degrees F oral Pulse rate:   68 / minute Pulse rhythm:   regular Resp:     16 per minute BP sitting:   100 / 70  (left arm) Cuff size:   regular  Vitals Entered By: Lanier Prude, CMA(AAMA) (June 29, 2010 7:53 AM)  O2 Flow:  Room air CC: f/u Is Patient Diabetic? No Comments pt is not taking Zyrtec, Naproxen or Pennsaid.  Please remove from list.   CC:  f/u.  History of Present Illness: F/u depression, anxiety - better  Current Medications (verified): 1)  Vitamin D3 1000 Unit  Tabs (Cholecalciferol) .Marland Kitchen.. 1 By Mouth Daily 2)  Fluoxetine Hcl 10 Mg  Caps (Fluoxetine Hcl) .... Take 1 Capsule By Mouth Daily 3)  Lorazepam 1 Mg  Tabs (Lorazepam) .... 1/2 - 1 By Mouth Bid Prn 4)  Zyrtec Hives Relief 10 Mg Tabs (Cetirizine Hcl) .... As Needed 5)  Naproxen 500 Mg Tabs (Naproxen) .Marland Kitchen.. 1po Two Times A Day As Needed 6)  Pennsaid 1.5 % Soln (Diclofenac Sodium) .... 3-5 Gtt On Skin Three Times A Day For Pain  Allergies (verified): No Known Drug Allergies  Past History:  Past Medical History: Last updated: 01/14/2008 Allergic rhinitis DVT, hx of History of factor V leyden mutation/hypercoagulable state  Social History: Last updated: 12/09/2007 Never Smoked Alcohol use-yes  Review of Systems       The patient complains of depression.    Physical Exam  General:  alert and well-developed.   Nose:  no external deformity and no nasal discharge.   Mouth:  no gingival abnormalities and pharynx pink and moist.   Lungs:  normal respiratory effort and normal breath sounds.   Heart:  normal rate and regular rhythm.   Psych:  Cognition and judgment appear intact. Alert and cooperative with normal attention span and concentration. No apparent delusions, illusions, hallucinations. Not  depressed   Impression & Recommendations:  Problem # 1:  DEPRESSION (ICD-311) Assessment Improved  The following medications were removed from the medication list:    Fluoxetine Hcl 10 Mg Caps (Fluoxetine hcl) .Marland Kitchen... Take 1 capsule by mouth daily Her updated medication list for this problem includes:    Lorazepam 1 Mg Tabs (Lorazepam) .Marland Kitchen... 1/2 - 1 by mouth bid prn  Problem # 2:  ALLERGIC RHINITIS (ICD-477.9) Assessment: Improved  Her updated medication list for this problem includes:    Zyrtec Hives Relief 10 Mg Tabs (Cetirizine hcl) .Marland Kitchen... As needed  Complete Medication List: 1)  Vitamin D3 1000 Unit Tabs (Cholecalciferol) .Marland Kitchen.. 1 by mouth daily 2)  Lorazepam 1 Mg Tabs (Lorazepam) .... 1/2 - 1 by mouth bid prn 3)  Zyrtec Hives Relief 10 Mg Tabs (Cetirizine hcl) .... As needed 4)  Naproxen 500 Mg Tabs (Naproxen) .Marland Kitchen.. 1po two times a day as needed 5)  Pennsaid 1.5 % Soln (Diclofenac sodium) .... 3-5 gtt on skin three times a day for pain  Patient Instructions: 1)  Please schedule a follow-up appointment in 6 months well w/labs.

## 2011-04-27 NOTE — Assessment & Plan Note (Signed)
[facility]                           PRIMARY CARE OFFICE NOTE   NAME:Jody Ford, Jody Ford                    MRN:          [id number]  DATE:03/28/2007                            DOB:          1966/01/23    The patient is a 45 year old female, who presents for a wellness  examination.   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  History of superficial phlebitis in 1998.  Allergic rhinitis.   SOCIAL HISTORY:  She does not smoke, does not drink, works at a bank.   FAMILY HISTORY:  Sister was recently diagnosed with breast cancer.  Mother died with leukemia.  There are some relatives with diabetes.   CURRENT MEDICINES:  Multivitamin.   REVIEW OF SYSTEMS:  No chest pain or shortness of breath, no syncope, no  skin complaints.  Had some breast cysts in the past.  No clots, no lower  extremity pain or swelling unless she travels.  The rest of the 18-part  review of systems is negative.   PHYSICAL:  Blood pressure 107/68, pulse 69, temperature 97.1, weight 163  pounds.  She looks well.  She is in no acute distress.  HEENT:  With moist mucosa.  NECK:  Supple, no thyromegaly or bruit.  LUNGS:  Clear, no wheezes or rales.  CARDIAC:  S1, S2, no murmur, no gallop.  ABDOMEN:  Soft, nontender, no organomegaly.  LOWER EXTREMITIES:  Without edema.  NEUROLOGIC:  She is alert, oriented, cooperative.  Does not seem  depressed.   LABS:  On March 20, 2007, CBC normal, CMET normal, glucose 104,  cholesterol 135, TSH normal, urinalysis normal.   ASSESSMENT AND PLAN:  1. Normal wellness examination.  Age/health-related issues discussed.      Healthy lifestyle discussed.  Regular GYN care with [facility].      She has been exercising three times a week.  She takes a      multivitamin a day.  Advised to take calcium and vitamin D.  2. Breast cysts/fibrocystic breast disease.  Left breast MRI is      pending in July.  She is  status post breast ultrasound.  She will      discuss vitamin E with her gynecologist.  3. History of Factor V Leyden mutation/hypercoagulable state.  Advised      to take aspirin and      use compression stocking when travels.  4. Allergic rhinitis.  Doing well on p.r.n. therapy.     Georgina Quint. Plotnikov, MD  Electronically Signed    AVP/MedQ  DD: 03/28/2007  DT: 03/28/2007  Job #: [id number]

## 2011-05-21 ENCOUNTER — Other Ambulatory Visit: Payer: Self-pay | Admitting: Obstetrics and Gynecology

## 2011-05-21 DIAGNOSIS — Z1231 Encounter for screening mammogram for malignant neoplasm of breast: Secondary | ICD-10-CM

## 2011-06-06 ENCOUNTER — Other Ambulatory Visit: Payer: Self-pay | Admitting: Obstetrics and Gynecology

## 2011-06-06 DIAGNOSIS — N63 Unspecified lump in unspecified breast: Secondary | ICD-10-CM

## 2011-06-26 ENCOUNTER — Ambulatory Visit
Admission: RE | Admit: 2011-06-26 | Discharge: 2011-06-26 | Disposition: A | Discharge status: Home / Self Care | Payer: 59 | Source: Ambulatory Visit | Attending: Obstetrics and Gynecology | Admitting: Obstetrics and Gynecology

## 2011-06-26 ENCOUNTER — Ambulatory Visit: Payer: Self-pay

## 2011-06-26 DIAGNOSIS — N63 Unspecified lump in unspecified breast: Secondary | ICD-10-CM

## 2011-09-04 ENCOUNTER — Other Ambulatory Visit: Payer: Self-pay | Admitting: Internal Medicine

## 2011-09-04 ENCOUNTER — Encounter (INDEPENDENT_AMBULATORY_CARE_PROVIDER_SITE_OTHER): Payer: PRIVATE HEALTH INSURANCE | Admitting: *Deleted

## 2011-09-04 ENCOUNTER — Encounter: Payer: Self-pay | Admitting: Internal Medicine

## 2011-09-04 ENCOUNTER — Ambulatory Visit (INDEPENDENT_AMBULATORY_CARE_PROVIDER_SITE_OTHER): Payer: PRIVATE HEALTH INSURANCE | Admitting: Internal Medicine

## 2011-09-04 VITALS — BP 102/68 | HR 62 | Temp 98.1°F | Ht 64.0 in | Wt 156.4 lb

## 2011-09-04 DIAGNOSIS — I8000 Phlebitis and thrombophlebitis of superficial vessels of unspecified lower extremity: Secondary | ICD-10-CM

## 2011-09-04 DIAGNOSIS — F329 Major depressive disorder, single episode, unspecified: Secondary | ICD-10-CM

## 2011-09-04 DIAGNOSIS — M79609 Pain in unspecified limb: Secondary | ICD-10-CM

## 2011-09-04 DIAGNOSIS — Z86718 Personal history of other venous thrombosis and embolism: Secondary | ICD-10-CM

## 2011-09-04 DIAGNOSIS — M7989 Other specified soft tissue disorders: Secondary | ICD-10-CM

## 2011-09-04 MED ORDER — DICLOFENAC SODIUM 1.5 % TD SOLN: TRANSDERMAL | Status: DC

## 2011-09-04 MED ORDER — ASPIRIN EC 81 MG PO TBEC: 81.0000 mg | DELAYED_RELEASE_TABLET | Freq: Every day | ORAL | Status: AC

## 2011-09-04 MED ORDER — ACETAMINOPHEN-CODEINE 300-30 MG PO TABS: 1.0000 | ORAL_TABLET | Freq: Four times a day (QID) | ORAL | Status: AC | PRN

## 2011-09-04 NOTE — Assessment & Plan Note (Signed)
New onset x 4 wks, 3 discrete small areas to left leg; with hx of hypercoag state/factor V, DVT (18 yrs ago), and no ongoing anti-plt use;  Though signs or small, will ask for LLE venous doppler to r/o new or residual evidence for more recent DVT (to be tx with coumadin therefore), and for now to start ASA 81 mg daily, pennsaid topical prn, tylenol #3 prn, and  to f/u any worsening symptoms or concerns

## 2011-09-04 NOTE — Patient Instructions (Signed)
Please start daily Aspirin 81 mg - 1 per day - COATED only Continue all other medications as before Please see [facility] now for the left leg vein doppler test Take all new medications as prescribed - the oral and topical pain medications If the left leg doppler shows any current or recent DVT, you may need to start Coumadin

## 2011-09-04 NOTE — Assessment & Plan Note (Signed)
stable overall by hx and exam, most recent data reviewed with pt, and pt to continue medical treatment as before  Lab Results  Component Value Date   WBC 5.1 12/25/2010   HGB 12.9 12/25/2010   HCT 38.4 12/25/2010   PLT 233.0 12/25/2010   CHOL 160 12/25/2010   TRIG 54.0 12/25/2010   HDL 67.80 12/25/2010   ALT 13 12/25/2010   AST 13 12/25/2010   NA 133* 12/25/2010   K 4.6 12/25/2010   CL 103 12/25/2010   CREATININE 0.6 12/25/2010   BUN 15 12/25/2010   CO2 24 12/25/2010   TSH 2.00 12/25/2010   HGBA1C 5.8 06/17/2009

## 2011-09-04 NOTE — Progress Notes (Signed)
  Subjective:    Patient ID: Jody Ford, female    DOB: 25-Jul-1966, 45 y.o.   MRN: [id number]  HPI  44yo WF with hx of DVT approx 18 yrs ago, intermittent ASA use since off coumadin for initial illness (none recent except a few doses in the past few wks), with prob mild post phlebitic swelling, now with 4 wks ongoing areas getting larger in size/area to left leg of ? superfical phlebitiis, started at the more distal medial left leg, then post distal leg, and most recently involving the prox post lat aspect of the leg as well.  No recent venous doppler.  Has known hypercoag/factor V mutation.  No recent unusual bleeding or bruising. Pt denies chest pain, increased sob or doe, wheezing, orthopnea, PND, increased LE swelling, palpitations, dizziness or syncope.  Pt denies new neurological symptoms such as new headache, or facial or extremity weakness or numbness.   Pt denies polydipsia, polyuria.  Denies worsening depressive symptoms, suicidal ideation, or panic.  Past Medical History  Diagnosis Date  . Allergy   . Personal history of DVT (deep vein thrombosis)   . Factor V Leiden mutation     History of, hyper coagulation stable   Past Surgical History  Procedure Date  . None     reports that she has never smoked. She does not have any smokeless tobacco history on file. She reports that she drinks alcohol. Her drug history not on file. family history includes Cancer in her mother and sister; Diabetes in her mother; Heart disease in her mother; and Stroke in her mother. No Known Allergies Current Outpatient Prescriptions on File Prior to Visit  Medication Sig Dispense Refill  . cetirizine (ZYRTEC HIVES RELIEF) 10 MG tablet Take 10 mg by mouth daily.        . cholecalciferol (VITAMIN D) 1000 UNITS tablet Take 1,000 Units by mouth daily.         Review of Systems Review of Systems  Constitutional: Negative for diaphoresis and unexpected weight change.  HENT: Negative for drooling and  tinnitus.   Eyes: Negative for photophobia and visual disturbance.  Respiratory: Negative for choking and stridor.   Gastrointestinal: Negative for vomiting and blood in stool.     Objective:   Physical Exam BP 102/68  Pulse 62  Temp(Src) 98.1 F (36.7 C) (Oral)  Ht 5\' 4"  (1.626 m)  Wt 156 lb 6 oz (70.931 kg)  BMI 26.84 kg/m2  SpO2 98% Physical Exam  VS noted, not ill appearing Constitutional: Pt appears well-developed and well-nourished.  HENT: Head: Normocephalic.  Right Ear: External ear normal.  Left Ear: External ear normal.  Eyes: Conjunctivae and EOM are normal. Pupils are equal, round, and reactive to light.  Neck: Normal range of motion. Neck supple.  Cardiovascular: Normal rate and regular rhythm.   Pulmonary/Chest: Effort normal and breath sounds normal.  Abd:  Soft, NT, non-distended, + BS Neurological: Pt is alert. No cranial nerve deficit.  Skin: Skin is warm. No erythema.  3 discrete areas of warm, red, tender to LLE as per HPI noted with diffuse left leg swelling, ? Mild calf tender, neg homans Psychiatric: Pt behavior is normal. Thought content normal. mild nervous, not depressed affect        Assessment & Plan:

## 2011-10-16 ENCOUNTER — Ambulatory Visit (INDEPENDENT_AMBULATORY_CARE_PROVIDER_SITE_OTHER): Payer: PRIVATE HEALTH INSURANCE | Admitting: Internal Medicine

## 2011-10-16 ENCOUNTER — Encounter: Payer: Self-pay | Admitting: Internal Medicine

## 2011-10-16 VITALS — BP 102/70 | HR 84 | Temp 98.2°F | Resp 16 | Wt 156.0 lb

## 2011-10-16 DIAGNOSIS — I872 Venous insufficiency (chronic) (peripheral): Secondary | ICD-10-CM

## 2011-10-16 DIAGNOSIS — L97909 Non-pressure chronic ulcer of unspecified part of unspecified lower leg with unspecified severity: Secondary | ICD-10-CM | POA: Insufficient documentation

## 2011-10-16 DIAGNOSIS — I83009 Varicose veins of unspecified lower extremity with ulcer of unspecified site: Secondary | ICD-10-CM

## 2011-10-16 MED ORDER — SILVER SULFADIAZINE 1 % EX CREA: TOPICAL_CREAM | CUTANEOUS | Status: DC

## 2011-10-16 NOTE — Assessment & Plan Note (Signed)
Wounds were debrided and dressed Silvadine bid

## 2011-10-16 NOTE — Progress Notes (Signed)
  Subjective:    Patient ID: Jody Ford, female    DOB: 25-Oct-1966, 45 y.o.   MRN: [id number]  HPI  C/o 2 sores on L leg - 6 wks. Leg [place] in 9/12 did not show any acute clot C/o leg edema, mild  Review of Systems  Constitutional: Negative for chills, activity change, appetite change, fatigue and unexpected weight change.  HENT: Negative for congestion, mouth sores and sinus pressure.   Eyes: Negative for visual disturbance.  Respiratory: Negative for cough and chest tightness.   Gastrointestinal: Negative for nausea and abdominal pain.  Genitourinary: Negative for frequency, difficulty urinating and vaginal pain.  Musculoskeletal: Negative for back pain and gait problem.  Skin: Negative for pallor and rash (as above).  Neurological: Negative for dizziness, tremors, weakness, numbness and headaches.  Psychiatric/Behavioral: Negative for confusion and sleep disturbance.       Objective:   Physical Exam  Constitutional: She appears well-developed and well-nourished.  Musculoskeletal: She exhibits edema (trace). She exhibits no tenderness.  Neurological: She has normal reflexes. She displays normal reflexes. Coordination normal.  Skin: No rash noted. There is erythema.       5 mm wide scabbed ulcers on inner dist shin x2 with mild erythema around, NT    Procedure note:  Ulcer debridement   Indication : skin ulcer x 2 L distal shin   Risks including a need for repeat procedure, scar, infection and others as well as benefits were explained to the patient in detail. Verbal consent was obtained and signed.    The patient was placed in a decubitus position. The area of both ulcers was prepped with povidone- iodine. Necrotic tissues were debrided with a scalpel and forceps.    The wounds were dressed with silvadine ointment and a bandaid.  Tolerated well. Complications: None.   Wound instructions provided.   Wound instructions : change dressing once a day or twice a day is  needed. Change dressing after  shower in the morning.  Pat dry the wound with gauze. Re-dress wound with antibiotic ointment and Telfa pad or a Band-Aid of appropriate size.   Please contact us if you notice collection of pus or  fever and chills, increased pain or redness, increased swelling in the area.       Assessment & Plan:

## 2011-10-16 NOTE — Patient Instructions (Signed)
Compr socks grade 1 Elevate legs

## 2011-10-16 NOTE — Assessment & Plan Note (Signed)
See instructions Vein clinic consult

## 2011-10-23 ENCOUNTER — Telehealth: Payer: Self-pay | Admitting: Internal Medicine

## 2011-10-23 NOTE — Telephone Encounter (Signed)
Doppler faxed to [facility] @ [phone]  10/23/11/km

## 2012-05-22 ENCOUNTER — Other Ambulatory Visit: Payer: Self-pay | Admitting: Obstetrics and Gynecology

## 2012-05-22 DIAGNOSIS — Z1231 Encounter for screening mammogram for malignant neoplasm of breast: Secondary | ICD-10-CM

## 2012-07-01 ENCOUNTER — Other Ambulatory Visit: Payer: Self-pay | Admitting: Obstetrics and Gynecology

## 2012-07-01 ENCOUNTER — Ambulatory Visit
Admission: RE | Admit: 2012-07-01 | Discharge: 2012-07-01 | Disposition: A | Discharge status: Home / Self Care | Payer: PRIVATE HEALTH INSURANCE | Source: Ambulatory Visit | Attending: Obstetrics and Gynecology | Admitting: Obstetrics and Gynecology

## 2012-07-01 DIAGNOSIS — N63 Unspecified lump in unspecified breast: Secondary | ICD-10-CM

## 2012-07-01 DIAGNOSIS — Z1231 Encounter for screening mammogram for malignant neoplasm of breast: Secondary | ICD-10-CM

## 2012-07-04 ENCOUNTER — Ambulatory Visit
Admission: RE | Admit: 2012-07-04 | Discharge: 2012-07-04 | Disposition: A | Discharge status: Home / Self Care | Payer: PRIVATE HEALTH INSURANCE | Source: Ambulatory Visit | Attending: Obstetrics and Gynecology | Admitting: Obstetrics and Gynecology

## 2012-07-04 DIAGNOSIS — N63 Unspecified lump in unspecified breast: Secondary | ICD-10-CM

## 2012-07-19 ENCOUNTER — Ambulatory Visit (INDEPENDENT_AMBULATORY_CARE_PROVIDER_SITE_OTHER): Payer: PRIVATE HEALTH INSURANCE | Admitting: Family Medicine

## 2012-07-19 ENCOUNTER — Encounter: Payer: Self-pay | Admitting: Family Medicine

## 2012-07-19 VITALS — BP 102/64 | HR 78 | Temp 98.0°F | Ht 64.0 in | Wt 156.0 lb

## 2012-07-19 DIAGNOSIS — T300 Burn of unspecified body region, unspecified degree: Secondary | ICD-10-CM

## 2012-07-19 DIAGNOSIS — T304 Corrosion of unspecified body region, unspecified degree: Secondary | ICD-10-CM | POA: Insufficient documentation

## 2012-07-19 DIAGNOSIS — IMO0002 Reserved for concepts with insufficient information to code with codable children: Secondary | ICD-10-CM

## 2012-07-19 DIAGNOSIS — R609 Edema, unspecified: Secondary | ICD-10-CM | POA: Insufficient documentation

## 2012-07-19 MED ORDER — SILVER SULFADIAZINE 1 % EX CREA: TOPICAL_CREAM | Freq: Two times a day (BID) | CUTANEOUS | Status: DC

## 2012-07-19 NOTE — Patient Instructions (Addendum)
Follow up w/ Dr Guss Bunde next week Elevate, ice, and compress your leg as much as possible Switch from the Bacitracin to the Silvadene Call with any questions or concerns Hang in there!!!
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
## 2012-07-19 NOTE — Assessment & Plan Note (Signed)
New.  Switch from Bacitracin as pt has been applying this for 2 weeks w/out improvement, to silvadene.  Encouraged her to f/u w/ Dr Guss Bunde next week.  Reviewed supportive care and red flags that should prompt return.  Pt expressed understanding and is in agreement w/ plan.
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
## 2013-05-19 ENCOUNTER — Other Ambulatory Visit: Payer: Self-pay

## 2013-05-19 DIAGNOSIS — Z1231 Encounter for screening mammogram for malignant neoplasm of breast: Secondary | ICD-10-CM

## 2013-07-01 ENCOUNTER — Encounter: Payer: Self-pay | Admitting: Internal Medicine

## 2013-07-01 ENCOUNTER — Ambulatory Visit (INDEPENDENT_AMBULATORY_CARE_PROVIDER_SITE_OTHER): Payer: BC Managed Care – PPO | Admitting: Internal Medicine

## 2013-07-01 VITALS — BP 130/72 | HR 80 | Temp 97.5°F | Resp 16 | Wt 162.0 lb

## 2013-07-01 DIAGNOSIS — H8110 Benign paroxysmal vertigo, unspecified ear: Secondary | ICD-10-CM | POA: Insufficient documentation

## 2013-07-01 MED ORDER — MECLIZINE HCL 12.5 MG PO TABS: 12.5000 mg | ORAL_TABLET | Freq: Three times a day (TID) | ORAL | Status: DC | PRN

## 2013-07-01 NOTE — Patient Instructions (Addendum)
Benign Positional Vertigo symptoms on the right Start Meclizine. Start Brandt - Daroff exercise several times a day as dirrected.  

## 2013-07-01 NOTE — Progress Notes (Signed)
   Subjective:     HPI  C/o dizziness and vertigo x 3 d, spinning sensation  Review of Systems  Constitutional: Negative for chills, activity change, appetite change, fatigue and unexpected weight change.  HENT: Negative for congestion, mouth sores and sinus pressure.   Eyes: Negative for visual disturbance.  Respiratory: Negative for cough and chest tightness.   Gastrointestinal: Negative for nausea and abdominal pain.  Genitourinary: Negative for frequency, difficulty urinating and vaginal pain.  Musculoskeletal: Negative for back pain and gait problem.  Skin: Negative for pallor and rash (as above).  Neurological: Positive for dizziness. Negative for tremors, weakness, light-headedness, numbness and headaches.  Hematological: Does not bruise/bleed easily.  Psychiatric/Behavioral: Negative for confusion and sleep disturbance.       Objective:   Physical Exam  Constitutional: She appears well-developed and well-nourished.  HENT:  Head: Normocephalic.  Mouth/Throat: Oropharynx is clear and moist.  Eyes: Right eye exhibits no discharge. No scleral icterus.  Musculoskeletal: She exhibits no edema and no tenderness.  Neurological: She has normal reflexes. No cranial nerve deficit. Coordination normal.  Skin: No rash noted. No erythema.    H-P (+) on R      Assessment & Plan:

## 2013-07-01 NOTE — Assessment & Plan Note (Signed)
Benign Positional Vertigo symptoms on the right Start Meclizine. Start Brandt - Daroff exercise several times a day as dirrected.  

## 2013-07-07 ENCOUNTER — Ambulatory Visit
Admission: RE | Admit: 2013-07-07 | Discharge: 2013-07-07 | Disposition: A | Discharge status: Home / Self Care | Payer: BC Managed Care – PPO | Source: Ambulatory Visit

## 2013-07-07 DIAGNOSIS — Z1231 Encounter for screening mammogram for malignant neoplasm of breast: Secondary | ICD-10-CM

## 2013-08-17 ENCOUNTER — Encounter: Payer: Self-pay | Admitting: Internal Medicine

## 2013-08-17 ENCOUNTER — Ambulatory Visit (INDEPENDENT_AMBULATORY_CARE_PROVIDER_SITE_OTHER): Payer: BC Managed Care – PPO | Admitting: Internal Medicine

## 2013-08-17 DIAGNOSIS — H6590 Unspecified nonsuppurative otitis media, unspecified ear: Secondary | ICD-10-CM | POA: Insufficient documentation

## 2013-08-17 MED ORDER — FLUTICASONE PROPIONATE 50 MCG/ACT NA SUSP: 2.0000 | Freq: Every day | NASAL | Status: DC

## 2013-08-17 MED ORDER — FLUCONAZOLE 150 MG PO TABS: 150.0000 mg | ORAL_TABLET | Freq: Once | ORAL | Status: DC

## 2013-08-17 MED ORDER — CEFUROXIME AXETIL 500 MG PO TABS: ORAL_TABLET | ORAL | Status: DC

## 2013-08-17 NOTE — Assessment & Plan Note (Signed)
Ceftin x 10 d/Diflucan Flonase nasal Zyrtec

## 2013-08-17 NOTE — Patient Instructions (Signed)
   Milk free trial (no milk, ice cream, cheese and yogurt) for 4-6 weeks. OK to use almond, coconut, rice or soy milk. "Almond breeze" brand tastes good.  

## 2013-08-17 NOTE — Progress Notes (Signed)
   Subjective:     HPI  C/o B ears popping C/o dizziness and vertigo x rare, spinning sensation  Review of Systems  Constitutional: Negative for chills, activity change, appetite change, fatigue and unexpected weight change.  HENT: Negative for congestion, mouth sores and sinus pressure.   Eyes: Negative for visual disturbance.  Respiratory: Negative for cough and chest tightness.   Gastrointestinal: Negative for nausea and abdominal pain.  Genitourinary: Negative for frequency, difficulty urinating and vaginal pain.  Musculoskeletal: Negative for back pain and gait problem.  Skin: Negative for pallor and rash (as above).  Neurological: Positive for dizziness. Negative for tremors, weakness, light-headedness, numbness and headaches.  Hematological: Does not bruise/bleed easily.  Psychiatric/Behavioral: Negative for confusion and sleep disturbance.       Objective:   Physical Exam  Constitutional: She appears well-developed and well-nourished.  HENT:  Head: Normocephalic.  Mouth/Throat: Oropharynx is clear and moist.  Eyes: Right eye exhibits no discharge. No scleral icterus.  Musculoskeletal: She exhibits no edema and no tenderness.  Neurological: She has normal reflexes. No cranial nerve deficit. Coordination normal.  Skin: No rash noted. No erythema.  fluid behind B TMs        Assessment & Plan:

## 2013-09-17 ENCOUNTER — Other Ambulatory Visit (INDEPENDENT_AMBULATORY_CARE_PROVIDER_SITE_OTHER): Payer: BC Managed Care – PPO

## 2013-09-17 ENCOUNTER — Encounter: Payer: Self-pay | Admitting: Internal Medicine

## 2013-09-17 ENCOUNTER — Ambulatory Visit (INDEPENDENT_AMBULATORY_CARE_PROVIDER_SITE_OTHER): Payer: BC Managed Care – PPO | Admitting: Internal Medicine

## 2013-09-17 VITALS — BP 110/72 | HR 88 | Temp 97.5°F | Resp 12 | Wt 162.0 lb

## 2013-09-17 DIAGNOSIS — I6350 Cerebral infarction due to unspecified occlusion or stenosis of unspecified cerebral artery: Secondary | ICD-10-CM

## 2013-09-17 DIAGNOSIS — R42 Dizziness and giddiness: Secondary | ICD-10-CM

## 2013-09-17 DIAGNOSIS — G467 Other lacunar syndromes: Secondary | ICD-10-CM

## 2013-09-17 DIAGNOSIS — H6590 Unspecified nonsuppurative otitis media, unspecified ear: Secondary | ICD-10-CM

## 2013-09-17 DIAGNOSIS — R471 Dysarthria and anarthria: Secondary | ICD-10-CM

## 2013-09-17 LAB — PROTIME-INR
INR: 1.1 ratio — ABNORMAL HIGH (ref 0.8–1.0)
Prothrombin Time: 11.4 s (ref 10.2–12.4)

## 2013-09-17 LAB — BASIC METABOLIC PANEL WITH GFR
BUN: 14 mg/dL (ref 6–23)
CO2: 25 meq/L (ref 19–32)
Calcium: 8.9 mg/dL (ref 8.4–10.5)
Chloride: 104 meq/L (ref 96–112)
Creatinine, Ser: 0.7 mg/dL (ref 0.4–1.2)
GFR: 96.6 mL/min
Glucose, Bld: 110 mg/dL — ABNORMAL HIGH (ref 70–99)
Potassium: 3.8 meq/L (ref 3.5–5.1)
Sodium: 138 meq/L (ref 135–145)

## 2013-09-17 LAB — CBC WITH DIFFERENTIAL/PLATELET
Basophils Relative: 0.5 % (ref 0.0–3.0)
Eosinophils Absolute: 0.2 10*3/uL (ref 0.0–0.7)
Hemoglobin: 12.8 g/dL (ref 12.0–15.0)
Lymphocytes Relative: 28.9 % (ref 12.0–46.0)
MCHC: 34.1 g/dL (ref 30.0–36.0)
Monocytes Relative: 8.4 % (ref 3.0–12.0)
Neutro Abs: 2.9 10*3/uL (ref 1.4–7.7)
Neutrophils Relative %: 58.3 % (ref 43.0–77.0)
RBC: 4.02 Mil/uL (ref 3.87–5.11)
WBC: 4.9 10*3/uL (ref 4.5–10.5)

## 2013-09-17 LAB — URINALYSIS
Bilirubin Urine: NEGATIVE
Ketones, ur: NEGATIVE
Specific Gravity, Urine: 1.01 (ref 1.000–1.030)
Urine Glucose: NEGATIVE
pH: 6 (ref 5.0–8.0)

## 2013-09-17 LAB — SEDIMENTATION RATE: Sed Rate: 14 mm/h (ref 0–22)

## 2013-09-17 LAB — HEPATIC FUNCTION PANEL
AST: 18 U/L (ref 0–37)
Albumin: 4.1 g/dL (ref 3.5–5.2)
Alkaline Phosphatase: 53 U/L (ref 39–117)
Total Protein: 6.8 g/dL (ref 6.0–8.3)

## 2013-09-17 LAB — LIPID PANEL
Cholesterol: 165 mg/dL (ref 0–200)
HDL: 81.4 mg/dL (ref >39.00)
LDL Cholesterol: 79 mg/dL (ref 0–99)
Total CHOL/HDL Ratio: 2
Triglycerides: 24 mg/dL (ref 0.0–149.0)

## 2013-09-17 LAB — TSH: TSH: 1.69 u[IU]/mL (ref 0.35–5.50)

## 2013-09-17 LAB — VITAMIN B12: Vitamin B-12: 308 pg/mL (ref 211–911)

## 2013-09-17 LAB — CK: Total CK: 97 U/L (ref 7–177)

## 2013-09-17 NOTE — Assessment & Plan Note (Signed)
ENT ref Refractory

## 2013-09-17 NOTE — Assessment & Plan Note (Signed)
10/14 ??? Possible MRI brain

## 2013-09-17 NOTE — Progress Notes (Signed)
   Subjective:     HPI  F/u B ears popping - not better F/u dizziness and vertigo every day, "swaying forward", spinning sensation - not better C/o R hand clumsiness - dropping things  Review of Systems  Constitutional: Negative for chills, activity change, appetite change, fatigue and unexpected weight change.  HENT: Negative for congestion, mouth sores and sinus pressure.   Eyes: Negative for visual disturbance.  Respiratory: Negative for cough and chest tightness.   Gastrointestinal: Negative for nausea and abdominal pain.  Genitourinary: Negative for frequency, difficulty urinating and vaginal pain.  Musculoskeletal: Negative for back pain and gait problem.  Skin: Negative for pallor and rash (as above).  Neurological: Positive for dizziness. Negative for tremors, weakness, light-headedness, numbness and headaches.  Hematological: Does not bruise/bleed easily.  Psychiatric/Behavioral: Negative for confusion and sleep disturbance.       Objective:   Physical Exam  Constitutional: She appears well-developed and well-nourished.  HENT:  Head: Normocephalic.  Mouth/Throat: Oropharynx is clear and moist.  Eyes: Right eye exhibits no discharge. No scleral icterus.  Musculoskeletal: She exhibits no edema and no tenderness.  Neurological: She has normal reflexes. No cranial nerve deficit. Coordination normal.  Skin: No rash noted. No erythema.  fluid behind B TMs A little ataxic; unable to stand on one foot w/eyes closed  A complex case      Assessment & Plan:

## 2013-09-17 NOTE — Assessment & Plan Note (Signed)
ENT ref and brain MRI

## 2013-09-18 ENCOUNTER — Ambulatory Visit: Payer: BC Managed Care – PPO | Admitting: Internal Medicine

## 2013-09-25 ENCOUNTER — Inpatient Hospital Stay
Admission: RE | Admit: 2013-09-25 | Discharge: 2013-09-25 | Disposition: A | Discharge status: Home / Self Care | Payer: BC Managed Care – PPO | Source: Ambulatory Visit | Attending: Internal Medicine | Admitting: Internal Medicine

## 2013-10-16 ENCOUNTER — Encounter: Payer: BC Managed Care – PPO | Admitting: Internal Medicine

## 2013-10-20 ENCOUNTER — Encounter: Payer: Self-pay | Admitting: Internal Medicine

## 2013-10-20 ENCOUNTER — Ambulatory Visit (INDEPENDENT_AMBULATORY_CARE_PROVIDER_SITE_OTHER): Payer: BC Managed Care – PPO | Admitting: Internal Medicine

## 2013-10-20 ENCOUNTER — Encounter: Payer: BC Managed Care – PPO | Admitting: Internal Medicine

## 2013-10-20 VITALS — BP 110/80 | HR 74 | Temp 98.0°F | Ht 65.0 in | Wt 162.0 lb

## 2013-10-20 DIAGNOSIS — R471 Dysarthria and anarthria: Secondary | ICD-10-CM

## 2013-10-20 DIAGNOSIS — Z23 Encounter for immunization: Secondary | ICD-10-CM

## 2013-10-20 DIAGNOSIS — Z0000 Encounter for general adult medical examination without abnormal findings: Secondary | ICD-10-CM | POA: Insufficient documentation

## 2013-10-20 DIAGNOSIS — D682 Hereditary deficiency of other clotting factors: Secondary | ICD-10-CM

## 2013-10-20 DIAGNOSIS — R7309 Other abnormal glucose: Secondary | ICD-10-CM

## 2013-10-20 DIAGNOSIS — I6350 Cerebral infarction due to unspecified occlusion or stenosis of unspecified cerebral artery: Secondary | ICD-10-CM

## 2013-10-20 DIAGNOSIS — I872 Venous insufficiency (chronic) (peripheral): Secondary | ICD-10-CM

## 2013-10-20 DIAGNOSIS — G467 Other lacunar syndromes: Secondary | ICD-10-CM

## 2013-10-20 DIAGNOSIS — R42 Dizziness and giddiness: Secondary | ICD-10-CM

## 2013-10-20 MED ORDER — TRIAMCINOLONE ACETONIDE 0.5 % EX OINT: 1.0000 "application " | TOPICAL_OINTMENT | Freq: Two times a day (BID) | CUTANEOUS | Status: DC

## 2013-10-20 MED ORDER — VASCULERA PO TABS: 1.0000 | ORAL_TABLET | Freq: Two times a day (BID) | ORAL | Status: DC

## 2013-10-20 NOTE — Assessment & Plan Note (Signed)
Resolved  MRI was not done

## 2013-10-20 NOTE — Assessment & Plan Note (Signed)
We discussed age appropriate health related issues, including available/recomended screening tests and vaccinations. We discussed a need for adhering to healthy diet and exercise. Labs/EKG were reviewed/ordered. All questions were answered.  PAP later today

## 2013-10-20 NOTE — Assessment & Plan Note (Signed)
ASA

## 2013-10-20 NOTE — Assessment & Plan Note (Signed)
Try Vasculera Triamc on ulcers

## 2013-10-20 NOTE — Progress Notes (Signed)
   Subjective:     HPI The patient is here for a wellness exam. The patient has been doing well overall without major physical or psychological issues going on lately.  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  HENT: Negative for congestion, dental problem, ear pain, hearing loss, mouth sores, postnasal drip, sinus pressure, sneezing, sore throat and voice change.   Eyes: Negative for pain and visual disturbance.  Respiratory: Negative for cough, chest tightness, wheezing and stridor.   Cardiovascular: Negative for chest pain, palpitations and leg swelling.  Gastrointestinal: Negative for nausea, vomiting, abdominal pain, blood in stool, abdominal distention and rectal pain.  Genitourinary: Negative for dysuria, frequency, hematuria, decreased urine volume, vaginal bleeding, vaginal discharge, difficulty urinating, vaginal pain and menstrual problem.  Musculoskeletal: Negative for back pain, gait problem, joint swelling and neck pain.  Skin: Negative for color change, pallor, rash (as above) and wound.  Neurological: Negative for dizziness, tremors, syncope, speech difficulty, weakness, light-headedness, numbness and headaches.  Hematological: Negative for adenopathy. Does not bruise/bleed easily.  Psychiatric/Behavioral: Negative for suicidal ideas, hallucinations, behavioral problems, confusion, sleep disturbance, dysphoric mood and decreased concentration. The patient is not nervous/anxious and is not hyperactive.        Objective:   Physical Exam  Constitutional: She appears well-developed and well-nourished. No distress.  HENT:  Head: Normocephalic.  Right Ear: External  ear normal.  Left Ear: External ear normal.  Nose: Nose normal.  Mouth/Throat: Oropharynx is clear and moist.  Eyes: Conjunctivae are normal. Pupils are equal, round, and reactive to light. Right eye exhibits no discharge. Left eye exhibits no discharge. No scleral icterus.  Neck: Normal range of motion. Neck supple. No JVD present. No tracheal deviation present. No thyromegaly present.  Cardiovascular: Normal rate, regular rhythm and normal heart sounds.   Pulmonary/Chest: No stridor. No respiratory distress. She has no wheezes.  Abdominal: Soft. Bowel sounds are normal. She exhibits no distension and no mass. There is no tenderness. There is no rebound and no guarding.  Musculoskeletal: She exhibits no edema and no tenderness.  Lymphadenopathy:    She has no cervical adenopathy.  Neurological: She has normal reflexes. No cranial nerve deficit. She exhibits normal muscle tone. Coordination normal.  Skin: No rash noted. No erythema.  Psychiatric: She has a normal mood and affect. Her behavior is normal. Judgment and thought content normal.  eczema and hyperpigmentation L ankle  Lab Results  Component Value Date   WBC 4.9 09/17/2013   HGB 12.8 09/17/2013   HCT 37.5 09/17/2013   PLT 233.0 09/17/2013   GLUCOSE 110* 09/17/2013   CHOL 165 09/17/2013   TRIG 24.0 09/17/2013   HDL 81.40 09/17/2013   LDLCALC 79 09/17/2013   ALT 19 09/17/2013   AST 18 09/17/2013   NA 138 09/17/2013   K 3.8 09/17/2013   CL 104 09/17/2013   CREATININE 0.7 09/17/2013   BUN 14 09/17/2013   CO2 25 09/17/2013   TSH 1.69 09/17/2013   INR 1.1* 09/17/2013   HGBA1C 5.8 06/17/2009        Assessment & Plan:

## 2013-10-20 NOTE — Assessment & Plan Note (Signed)
Resolved

## 2013-10-20 NOTE — Assessment & Plan Note (Signed)
Watching  

## 2013-10-20 NOTE — Progress Notes (Signed)
Pre visit review using our clinic review tool, if applicable. No additional management support is needed unless otherwise documented below in the visit note. 

## 2014-04-20 ENCOUNTER — Ambulatory Visit: Payer: BC Managed Care – PPO | Admitting: Internal Medicine

## 2014-05-01 ENCOUNTER — Emergency Department (HOSPITAL_BASED_OUTPATIENT_CLINIC_OR_DEPARTMENT_OTHER)
Admission: EM | Admit: 2014-05-01 | Discharge: 2014-05-01 | Disposition: A | Discharge status: Home / Self Care | Payer: BC Managed Care – PPO | Attending: Emergency Medicine | Admitting: Emergency Medicine

## 2014-05-01 ENCOUNTER — Encounter (HOSPITAL_BASED_OUTPATIENT_CLINIC_OR_DEPARTMENT_OTHER): Payer: Self-pay | Admitting: Emergency Medicine

## 2014-05-01 DIAGNOSIS — R197 Diarrhea, unspecified: Secondary | ICD-10-CM | POA: Insufficient documentation

## 2014-05-01 DIAGNOSIS — R21 Rash and other nonspecific skin eruption: Secondary | ICD-10-CM

## 2014-05-01 DIAGNOSIS — Z862 Personal history of diseases of the blood and blood-forming organs and certain disorders involving the immune mechanism: Secondary | ICD-10-CM | POA: Insufficient documentation

## 2014-05-01 DIAGNOSIS — Z7982 Long term (current) use of aspirin: Secondary | ICD-10-CM | POA: Insufficient documentation

## 2014-05-01 DIAGNOSIS — IMO0002 Reserved for concepts with insufficient information to code with codable children: Secondary | ICD-10-CM | POA: Insufficient documentation

## 2014-05-01 DIAGNOSIS — Z86718 Personal history of other venous thrombosis and embolism: Secondary | ICD-10-CM | POA: Insufficient documentation

## 2014-05-01 DIAGNOSIS — Z79899 Other long term (current) drug therapy: Secondary | ICD-10-CM | POA: Insufficient documentation

## 2014-05-01 DIAGNOSIS — R109 Unspecified abdominal pain: Secondary | ICD-10-CM | POA: Insufficient documentation

## 2014-05-01 LAB — URINALYSIS, ROUTINE W REFLEX MICROSCOPIC
BILIRUBIN URINE: NEGATIVE
Glucose, UA: NEGATIVE mg/dL
Hgb urine dipstick: NEGATIVE
KETONES UR: NEGATIVE mg/dL
Leukocytes, UA: NEGATIVE
NITRITE: NEGATIVE
PROTEIN: NEGATIVE mg/dL
Specific Gravity, Urine: 1.012 (ref 1.005–1.030)
UROBILINOGEN UA: 0.2 mg/dL (ref 0.0–1.0)
pH: 5.5 (ref 5.0–8.0)

## 2014-05-01 MED ORDER — HYDROCORTISONE 1 % EX CREA
TOPICAL_CREAM | Freq: Once | CUTANEOUS | Status: AC
  Administered 2014-05-01: 14:00:00 via TOPICAL
  Filled 2014-05-01: qty 28

## 2014-05-01 MED ORDER — HYDROCORTISONE 1 % EX CREA: TOPICAL_CREAM | CUTANEOUS | Status: AC

## 2014-05-01 MED ORDER — DIPHENHYDRAMINE HCL 25 MG PO TABS: 25.0000 mg | ORAL_TABLET | Freq: Four times a day (QID) | ORAL | Status: DC | PRN

## 2014-05-01 MED ORDER — DIPHENHYDRAMINE HCL 25 MG PO CAPS
25.0000 mg | ORAL_CAPSULE | Freq: Once | ORAL | Status: AC
  Administered 2014-05-01: 25 mg via ORAL
  Filled 2014-05-01: qty 1

## 2014-05-01 NOTE — ED Provider Notes (Signed)
CSN: [id number]     Arrival date & time 05/01/14  1014 History   First MD Initiated Contact with Patient 05/01/14 1048     Chief Complaint  Patient presents with  . Rash     (Consider location/radiation/quality/duration/timing/severity/associated sxs/prior Treatment) Patient is a 48 y.o. female presenting with rash.  Rash Location: Arms and legs. Quality: itchiness and redness   Quality: not painful   Severity:  Moderate Onset quality:  Gradual Duration: Itching present for 2 days, rash present for a few hours. Timing:  Constant Progression:  Worsening Chronicity:  New Context: not exposure to similar rash   Context comment:  Exposed to a lotion a few days ago, think she has used this before without difficulty.  Also works outside in her yard. No other specific, known exposures. Relieved by: Itching yesterday improved with Benadryl. Has not used Benadryl today. Associated symptoms: abdominal pain (cramping lower abdominal) and diarrhea (Loose stools x2)   Associated symptoms: no fever, no nausea, no shortness of breath, no sore throat, no throat swelling, no tongue swelling, not vomiting and not wheezing     Past Medical History  Diagnosis Date  . Allergy   . Personal history of DVT (deep vein thrombosis)   . Factor V Leiden mutation     History of, hyper coagulation stable   Past Surgical History  Procedure Laterality Date  . None     Family History  Problem Relation Age of Onset  . Cancer Mother     Leukemia  . Diabetes Mother   . Stroke Mother   . Heart disease Mother     MI  . Cancer Sister     Breast   History  Substance Use Topics  . Smoking status: Never Smoker   . Smokeless tobacco: Not on file     Comment: Regular Exercise - No. 4 cats 1 dogs no children  . Alcohol Use: Yes   OB History   Grav Para Term Preterm Abortions TAB SAB Ect Mult Living                 Review of Systems  Constitutional: Negative for fever.  HENT: Negative for sore  throat.   Respiratory: Negative for shortness of breath and wheezing.   Gastrointestinal: Positive for abdominal pain (cramping lower abdominal) and diarrhea (Loose stools x2). Negative for nausea and vomiting.  Skin: Positive for rash.  All other systems reviewed and are negative.     Allergies  Review of patient's allergies indicates no known allergies.  Home Medications   Prior to Admission medications   Medication Sig Start Date End Date Taking? Authorizing Provider  aspirin 81 MG tablet Take 81 mg by mouth daily.   Yes Historical Provider, MD  cholecalciferol (VITAMIN D) 1000 UNITS tablet Take 1,000 Units by mouth daily.     Yes Historical Provider, MD  Diclofenac Sodium 1.5 % SOLN Use up to 40 drops per site four times per day as needed for pain 09/04/11   Biagio Borg, MD  fluticasone [facility]) 50 MCG/ACT nasal spray Place 2 sprays into the nose daily. 08/17/13   Evie Lacks Plotnikov, MD  triamcinolone ointment (KENALOG) 0.5 % Apply 1 application topically 2 (two) times daily. 10/20/13   Evie Lacks Plotnikov, MD   BP 129/67  Pulse 90  Temp(Src) 98.2 F (36.8 C) (Oral)  Resp 16  Ht 5\' 5"  (1.651 m)  Wt 155 lb (70.308 kg)  BMI 25.79 kg/m2  SpO2 99% Physical Exam  Nursing note and vitals reviewed. Constitutional: She is oriented to person, place, and time. She appears well-developed and well-nourished. No distress.  HENT:  Head: Normocephalic and atraumatic.  Mouth/Throat: Oropharynx is clear and moist.  Eyes: Conjunctivae are normal. Pupils are equal, round, and reactive to light. No scleral icterus.  Neck: Neck supple.  Cardiovascular: Normal rate, regular rhythm, normal heart sounds and intact distal pulses.   No murmur heard. Pulmonary/Chest: Effort normal and breath sounds normal. No stridor. No respiratory distress. She has no rales.  Abdominal: Soft. Bowel sounds are normal. She exhibits no distension. There is no tenderness.  Musculoskeletal: Normal range of motion.   Neurological: She is alert and oriented to person, place, and time.  Skin: Skin is warm and dry. No rash noted.  Erythematous rash on both arms and both legs, worse on left upper extremity. Rash is not tender. No petechiae, no purpura.  Psychiatric: She has a normal mood and affect. Her behavior is normal.    ED Course  Procedures (including critical care time) Labs Review Labs Reviewed  URINALYSIS, ROUTINE W REFLEX MICROSCOPIC    Imaging Review No results found.   EKG Interpretation None      MDM   Final diagnoses:  Rash and nonspecific skin eruption    48 year old female with itchy rash. Itching started yesterday, rash started today. No fevers. Has had some mild lower abdominal cramping pain associated with 2 loose stools. Abdomen soft and nontender. Clinical picture not consistent with cellulitis, toxic shock syndrome, Stevens-Johnson/TEN.  Likely contact dermatitis. Plan treatment with Benadryl and hydrocortisone cream. Return precautions given   Houston Siren III, MD 05/01/14 1328

## 2014-05-01 NOTE — ED Notes (Signed)
Patient here with itching and feeling warm since Thursday. Has fine red fine rash to arms for same, also reports that yesterday am had top lip swelling and took benadryl with some relief. No new meds, etc. Went to nail salon on Wednesday and they used lotion to arms and neck. Last night increased itching and warm feeling.

## 2014-05-01 NOTE — Discharge Instructions (Signed)

## 2014-05-05 ENCOUNTER — Other Ambulatory Visit: Payer: BC Managed Care – PPO

## 2014-05-05 ENCOUNTER — Ambulatory Visit (INDEPENDENT_AMBULATORY_CARE_PROVIDER_SITE_OTHER): Payer: BC Managed Care – PPO | Admitting: Internal Medicine

## 2014-05-05 ENCOUNTER — Ambulatory Visit: Payer: BC Managed Care – PPO | Admitting: Physician Assistant

## 2014-05-05 ENCOUNTER — Encounter: Payer: Self-pay | Admitting: Internal Medicine

## 2014-05-05 VITALS — BP 110/68 | HR 80 | Temp 98.1°F | Resp 16 | Wt 161.0 lb

## 2014-05-05 DIAGNOSIS — L500 Allergic urticaria: Secondary | ICD-10-CM

## 2014-05-05 MED ORDER — PSEUDOEPHEDRINE HCL ER 120 MG PO TB12: 120.0000 mg | ORAL_TABLET | Freq: Two times a day (BID) | ORAL | Status: DC | PRN

## 2014-05-05 MED ORDER — METHYLPREDNISOLONE ACETATE 80 MG/ML IJ SUSP
80.0000 mg | Freq: Once | INTRAMUSCULAR | Status: AC
  Administered 2014-05-05: 80 mg via INTRAMUSCULAR

## 2014-05-05 MED ORDER — TRIAMCINOLONE ACETONIDE 0.5 % EX CREA
1.0000 | TOPICAL_CREAM | Freq: Three times a day (TID) | CUTANEOUS | Status: DC
Start: 2014-05-05 — End: 2014-10-11

## 2014-05-05 MED ORDER — DIPHENHYDRAMINE HCL 25 MG PO TABS: 25.0000 mg | ORAL_TABLET | Freq: Four times a day (QID) | ORAL | Status: DC | PRN

## 2014-05-05 MED ORDER — LORATADINE 10 MG PO TABS: 10.0000 mg | ORAL_TABLET | Freq: Every day | ORAL | Status: DC

## 2014-05-05 NOTE — Assessment & Plan Note (Signed)
5/15 acute ?etiol Depomedrol 80 mg im Labs

## 2014-05-05 NOTE — Progress Notes (Signed)
Pre visit review using our clinic review tool, if applicable. No additional management support is needed unless otherwise documented below in the visit note. 

## 2014-05-05 NOTE — Progress Notes (Signed)
  Subjective:     HPI  C/o hives since last Friday off and on. Benadryl helped Went to ER on Sat. C/o itching..  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  HENT: Negative for congestion, dental problem, ear pain, hearing loss, mouth sores, postnasal drip, sinus pressure, sneezing, sore throat and voice change.   Eyes: Negative for pain and visual disturbance.  Respiratory: Negative for cough, chest tightness, wheezing and stridor.   Cardiovascular: Negative for chest pain, palpitations and leg swelling.  Gastrointestinal: Negative for nausea, vomiting, abdominal pain, blood in stool, abdominal distention and rectal pain.  Genitourinary: Negative for dysuria, frequency, hematuria, decreased urine volume, vaginal bleeding, vaginal discharge, difficulty urinating, vaginal pain and menstrual problem.  Musculoskeletal: Negative for back pain, gait problem, joint swelling and neck pain.  Skin: Negative for color change, pallor, rash (as above) and wound.  Neurological: Negative for dizziness, tremors, syncope, speech difficulty, weakness, light-headedness, numbness and headaches.  Hematological: Negative for adenopathy. Does not bruise/bleed easily.  Psychiatric/Behavioral: Negative for suicidal ideas, hallucinations, behavioral problems, confusion, sleep disturbance, dysphoric mood and decreased concentration. The patient is not nervous/anxious and is not hyperactive.        Objective:   Physical Exam  Constitutional: She appears well-developed and well-nourished. No distress.  HENT:  Head: Normocephalic.  Right Ear: External ear normal.  Left Ear: External ear normal.  Nose: Nose normal.  Mouth/Throat:  Oropharynx is clear and moist.  Eyes: Conjunctivae are normal. Pupils are equal, round, and reactive to light. Right eye exhibits no discharge. Left eye exhibits no discharge. No scleral icterus.  Neck: Normal range of motion. Neck supple. No JVD present. No tracheal deviation present. No thyromegaly present.  Cardiovascular: Normal rate, regular rhythm and normal heart sounds.   Pulmonary/Chest: No stridor. No respiratory distress. She has no wheezes.  Abdominal: Soft. Bowel sounds are normal. She exhibits no distension and no mass. There is no tenderness. There is no rebound and no guarding.  Musculoskeletal: She exhibits no edema and no tenderness.  Lymphadenopathy:    She has no cervical adenopathy.  Neurological: She has normal reflexes. No cranial nerve deficit. She exhibits normal muscle tone. Coordination normal.  Skin: No rash noted. No erythema.  Psychiatric: She has a normal mood and affect. Her behavior is normal. Judgment and thought content normal.  eryth patches on skin  Lab Results  Component Value Date   WBC 4.9 09/17/2013   HGB 12.8 09/17/2013   HCT 37.5 09/17/2013   PLT 233.0 09/17/2013   GLUCOSE 110* 09/17/2013   CHOL 165 09/17/2013   TRIG 24.0 09/17/2013   HDL 81.40 09/17/2013   LDLCALC 79 09/17/2013   ALT 19 09/17/2013   AST 18 09/17/2013   NA 138 09/17/2013   K 3.8 09/17/2013   CL 104 09/17/2013   CREATININE 0.7 09/17/2013   BUN 14 09/17/2013   CO2 25 09/17/2013   TSH 1.69 09/17/2013   INR 1.1* 09/17/2013   HGBA1C 5.8 06/17/2009        Assessment & Plan:

## 2014-05-06 LAB — ALLERGY PROFILE REGION II-DC, DE, MD, ~~LOC~~, VA
ALTERNARIA ALTERNATA: 0.41 kU/L — AB
Bermuda Grass: <0.1 kU/L
Box Elder IgE: <0.1 kU/L
Cladosporium Herbarum: 1.15 kU/L — ABNORMAL HIGH
Common Ragweed: <0.1 kU/L
D. farinae: <0.1 kU/L
Dog Dander: <0.1 kU/L
Elm IgE: 0.12 kU/L — ABNORMAL HIGH
Johnson Grass: <0.1 kU/L
Lamb's Quarters: <0.1 kU/L
Meadow Grass: <0.1 kU/L
Oak: <0.1 kU/L

## 2014-05-06 LAB — ALLERGEN FOOD PROFILE SPECIFIC IGE
Apple: <0.1 kU/L
Chicken IgE: 0.44 kU/L — ABNORMAL HIGH
Corn: 0.17 kU/L — ABNORMAL HIGH
Egg White IgE: <0.1 kU/L
Fish Cod: 3.96 kU/L — ABNORMAL HIGH
IgE (Immunoglobulin E), Serum: 577.4 IU/mL — ABNORMAL HIGH (ref 0.0–180.0)
Orange: <0.1 kU/L
Shrimp IgE: 0.43 kU/L — ABNORMAL HIGH
Soybean IgE: <0.1 kU/L
Tomato IgE: <0.1 kU/L
Tuna IgE: 0.72 kU/L — ABNORMAL HIGH

## 2014-05-25 ENCOUNTER — Ambulatory Visit: Payer: BC Managed Care – PPO | Admitting: Internal Medicine

## 2014-06-01 ENCOUNTER — Other Ambulatory Visit: Payer: Self-pay

## 2014-06-01 DIAGNOSIS — Z1231 Encounter for screening mammogram for malignant neoplasm of breast: Secondary | ICD-10-CM

## 2014-07-13 ENCOUNTER — Other Ambulatory Visit: Payer: Self-pay | Admitting: Obstetrics and Gynecology

## 2014-07-13 ENCOUNTER — Ambulatory Visit
Admission: RE | Admit: 2014-07-13 | Discharge: 2014-07-13 | Disposition: A | Discharge status: Home / Self Care | Payer: BC Managed Care – PPO | Source: Ambulatory Visit

## 2014-07-13 DIAGNOSIS — R928 Other abnormal and inconclusive findings on diagnostic imaging of breast: Secondary | ICD-10-CM

## 2014-07-13 DIAGNOSIS — Z1231 Encounter for screening mammogram for malignant neoplasm of breast: Secondary | ICD-10-CM

## 2014-07-14 ENCOUNTER — Ambulatory Visit: Payer: BC Managed Care – PPO | Admitting: Internal Medicine

## 2014-07-16 ENCOUNTER — Ambulatory Visit
Admission: RE | Admit: 2014-07-16 | Discharge: 2014-07-16 | Disposition: A | Discharge status: Home / Self Care | Payer: BC Managed Care – PPO | Source: Ambulatory Visit | Attending: Obstetrics and Gynecology | Admitting: Obstetrics and Gynecology

## 2014-07-16 DIAGNOSIS — R928 Other abnormal and inconclusive findings on diagnostic imaging of breast: Secondary | ICD-10-CM

## 2014-10-11 ENCOUNTER — Encounter: Payer: Self-pay | Admitting: Family

## 2014-10-11 ENCOUNTER — Ambulatory Visit (INDEPENDENT_AMBULATORY_CARE_PROVIDER_SITE_OTHER): Payer: BC Managed Care – PPO | Admitting: Family

## 2014-10-11 VITALS — BP 98/64 | HR 61 | Temp 97.9°F | Resp 16 | Wt 162.2 lb

## 2014-10-11 DIAGNOSIS — H6010 Cellulitis of external ear, unspecified ear: Secondary | ICD-10-CM | POA: Insufficient documentation

## 2014-10-11 DIAGNOSIS — H6012 Cellulitis of left external ear: Secondary | ICD-10-CM

## 2014-10-11 MED ORDER — CEPHALEXIN 500 MG PO CAPS: 500.0000 mg | ORAL_CAPSULE | Freq: Four times a day (QID) | ORAL | Status: DC

## 2014-10-11 NOTE — Progress Notes (Signed)
Pre visit review using our clinic review tool, if applicable. No additional management support is needed unless otherwise documented below in the visit note. 

## 2014-10-11 NOTE — Assessment & Plan Note (Addendum)
Cephalexin qid for 7 days.  Call if increased pain or redness or if symptoms do not improve.

## 2014-10-11 NOTE — Patient Instructions (Signed)
Start Keflex. Call if you develop increased pain, redness or swelling. Follow up in 1 week.

## 2014-10-11 NOTE — Progress Notes (Signed)
Subjective:    Patient ID: Jody Ford, female    DOB: 1966-02-16, 49 y.o.   MRN: [id number]  HPI Jody Ford is a 47 year old female who presents today with chief compliant of left ear lobe redness and swelling.  1. Left ear lobe pain - Swelling started a week ago.  Reports feeling some soreness after wearing an earring that she doesn't usually wear.  She says that the earlobe is not painful, but can feel some throbbing.  She has used peroxide and neosporin without any relief.  Endorses feeling some crust behind the earlobe but denies an discharge.     Review of Systems  Constitutional: Negative for fever, chills and fatigue.  HENT: Positive for ear pain. Negative for hearing loss.        Left earlobe pain and throbbing  Respiratory: Negative for cough and shortness of breath.   Cardiovascular: Negative for chest pain, palpitations and leg swelling.  Skin: Negative for color change, pallor and rash.   Past Medical History  Diagnosis Date  . Allergy   . Personal history of DVT (deep vein thrombosis)   . Factor V Leiden mutation     History of, hyper coagulation stable    History   Social History  . Marital Status: Married    Spouse Name: N/A    Number of Children: N/A  . Years of Education: N/A   Occupational History  . Not on file.   Social History Main Topics  . Smoking status: Never Smoker   . Smokeless tobacco: Not on file     Comment: Regular Exercise - No. 4 cats 1 dogs no children  . Alcohol Use: Yes  . Drug Use:   . Sexual Activity:    Other Topics Concern  . Not on file   Social History Narrative    Past Surgical History  Procedure Laterality Date  . None      Family History  Problem Relation Age of Onset  . Cancer Mother     Leukemia  . Diabetes Mother   . Stroke Mother   . Heart disease Mother     MI  . Cancer Sister     Breast    No Known Allergies  Current Outpatient Prescriptions on File Prior to Visit  Medication Sig  Dispense Refill  . aspirin 81 MG tablet Take 81 mg by mouth daily.    . cholecalciferol (VITAMIN D) 1000 UNITS tablet Take 1,000 Units by mouth daily.      . fluticasone (FLONASE) 50 MCG/ACT nasal spray Place 2 sprays into the nose daily. (Patient taking differently: Place 2 sprays into the nose daily as needed. ) 16 g 6  . loratadine (CLARITIN) 10 MG tablet Take 1 tablet (10 mg total) by mouth daily. 100 tablet 3   No current facility-administered medications on file prior to visit.    BP 98/64 mmHg  Pulse 61  Temp(Src) 97.9 F (36.6 C) (Oral)  Resp 16  Wt 162 lb 3.2 oz (73.573 kg)  SpO2 99%  LMP 09/20/2014       Objective:   Physical Exam  Constitutional: She is oriented to person, place, and time. She appears well-developed and well-nourished.  HENT:  Head: Normocephalic and atraumatic.  Right Ear: External ear normal.  Left ear lobe is swollen and tender. Erythema extends behind ear slightly behind the ear.   Lymphadenopathy:    She has no cervical adenopathy.  Neurological: She is alert and oriented  to person, place, and time.  Skin: Skin is warm and dry.  Psychiatric: She has a normal mood and affect. Her behavior is normal. Judgment and thought content normal.          Assessment & Plan:  Cephalexin qid for 7 days.

## 2014-10-20 ENCOUNTER — Ambulatory Visit: Payer: BC Managed Care – PPO | Admitting: Internal Medicine

## 2015-06-03 ENCOUNTER — Other Ambulatory Visit: Payer: Self-pay | Admitting: Obstetrics and Gynecology

## 2015-06-03 DIAGNOSIS — Z1231 Encounter for screening mammogram for malignant neoplasm of breast: Secondary | ICD-10-CM

## 2015-07-25 ENCOUNTER — Ambulatory Visit: Payer: Self-pay

## 2015-07-27 ENCOUNTER — Ambulatory Visit
Admission: RE | Admit: 2015-07-27 | Discharge: 2015-07-27 | Disposition: A | Discharge status: Home / Self Care | Payer: BLUE CROSS/BLUE SHIELD | Source: Ambulatory Visit | Attending: Obstetrics and Gynecology | Admitting: Obstetrics and Gynecology

## 2015-07-27 DIAGNOSIS — Z1231 Encounter for screening mammogram for malignant neoplasm of breast: Secondary | ICD-10-CM

## 2015-07-29 ENCOUNTER — Other Ambulatory Visit: Payer: Self-pay | Admitting: Obstetrics and Gynecology

## 2015-07-29 DIAGNOSIS — R928 Other abnormal and inconclusive findings on diagnostic imaging of breast: Secondary | ICD-10-CM

## 2015-08-04 ENCOUNTER — Ambulatory Visit
Admission: RE | Admit: 2015-08-04 | Discharge: 2015-08-04 | Disposition: A | Discharge status: Home / Self Care | Payer: BLUE CROSS/BLUE SHIELD | Source: Ambulatory Visit | Attending: Obstetrics and Gynecology | Admitting: Obstetrics and Gynecology

## 2015-08-04 DIAGNOSIS — R928 Other abnormal and inconclusive findings on diagnostic imaging of breast: Secondary | ICD-10-CM

## 2015-10-31 ENCOUNTER — Encounter: Payer: Self-pay | Admitting: Family

## 2015-10-31 ENCOUNTER — Ambulatory Visit (INDEPENDENT_AMBULATORY_CARE_PROVIDER_SITE_OTHER): Payer: BLUE CROSS/BLUE SHIELD | Admitting: Family

## 2015-10-31 VITALS — BP 109/62 | HR 60 | Temp 98.4°F | Resp 18 | Ht 64.5 in | Wt 164.8 lb

## 2015-10-31 DIAGNOSIS — B9789 Other viral agents as the cause of diseases classified elsewhere: Principal | ICD-10-CM

## 2015-10-31 DIAGNOSIS — J069 Acute upper respiratory infection, unspecified: Secondary | ICD-10-CM

## 2015-10-31 MED ORDER — GUAIFENESIN-CODEINE 100-10 MG/5ML PO SYRP: 5.0000 mL | ORAL_SOLUTION | Freq: Three times a day (TID) | ORAL | Status: DC | PRN

## 2015-10-31 NOTE — Patient Instructions (Signed)
Add mucinex D during the day. At night you can use cheratusin to help with cough.  Use nasal saline rinses. Call if symptoms worsen, if you develop fever >101, or if symptoms are not improved in 3-4 days.

## 2015-10-31 NOTE — Progress Notes (Signed)
Pre visit review using our clinic review tool, if applicable. No additional management support is needed unless otherwise documented below in the visit note.,  

## 2015-10-31 NOTE — Progress Notes (Signed)
   Subjective:    Patient ID: Jody Ford, female    DOB: [birth year], 49 y.o.   MRN: [id number]  HPI  [name] is a 49 yr old female who presents today with chief complaint of cough.  Reports that cough began 2 days ago and is productive of yellow sputum. Has associated nasal congestion.  Itchy eyes, mild sinus pressure.  Denies fever.  Has had some sick contacts at work.   Review of Systems    see HPI  Past Medical History  Diagnosis Date  . Allergy   . Personal history of DVT (deep vein thrombosis)   . Factor V Leiden mutation ([place])     History of, hyper coagulation stable    Social History   Social History  . Marital Status: Married    Spouse Name: N/A  . Number of Children: N/A  . Years of Education: N/A   Occupational History  . Not on file.   Social History Main Topics  . Smoking status: Never Smoker   . Smokeless tobacco: Not on file     Comment: Regular Exercise - No. 4 cats 1 dogs no children  . Alcohol Use: Yes  . Drug Use: Not on file  . Sexual Activity: Not on file   Other Topics Concern  . Not on file   Social History Narrative    Past Surgical History  Procedure Laterality Date  . None      Family History  Problem Relation Age of Onset  . Cancer Mother     Leukemia  . Diabetes Mother   . Stroke Mother   . Heart disease Mother     MI  . Cancer Sister     Breast    No Known Allergies  Current Outpatient Prescriptions on File Prior to Visit  Medication Sig Dispense Refill  . aspirin 81 MG tablet Take 81 mg by mouth daily.    . cholecalciferol (VITAMIN D) 1000 UNITS tablet Take 1,000 Units by mouth daily.       No current facility-administered medications on file prior to visit.    BP 109/62 mmHg  Pulse 60  Temp(Src) 98.4 F (36.9 C) (Oral)  Resp 18  Ht 5' 4.5" (1.638 m)  Wt 164 lb 12.8 oz (74.753 kg)  BMI 27.86 kg/m2  SpO2 100%  LMP 09/30/2015    Objective:   Physical Exam  Constitutional: She is oriented to  person, place, and time. She appears well-developed and well-nourished.  HENT:  Right Ear: Tympanic membrane and ear canal normal.  Left Ear: Tympanic membrane and ear canal normal.  Mouth/Throat: No oropharyngeal exudate, posterior oropharyngeal edema or posterior oropharyngeal erythema.  Cardiovascular: Normal rate, regular rhythm and normal heart sounds.   No murmur heard. Pulmonary/Chest: Effort normal and breath sounds normal. No respiratory distress. She has no wheezes.  Musculoskeletal: She exhibits no edema.  Neurological: She is alert and oriented to person, place, and time.  Psychiatric: She has a normal mood and affect. Her behavior is normal. Judgment and thought content normal.          Assessment & Plan:  Viral URI with cough- advised pt on supportive measures:   Add mucinex D during the day. At night you can use cheratusin to help with cough.  Use nasal saline rinses. Call if symptoms worsen, if you develop fever >101, or if symptoms are not improved in 3-4 days.

## 2016-06-20 ENCOUNTER — Other Ambulatory Visit: Payer: Self-pay | Admitting: Obstetrics and Gynecology

## 2016-06-20 DIAGNOSIS — Z1231 Encounter for screening mammogram for malignant neoplasm of breast: Secondary | ICD-10-CM

## 2016-07-12 ENCOUNTER — Ambulatory Visit (INDEPENDENT_AMBULATORY_CARE_PROVIDER_SITE_OTHER): Payer: BLUE CROSS/BLUE SHIELD | Admitting: Family Medicine

## 2016-07-12 ENCOUNTER — Encounter: Payer: Self-pay | Admitting: Family Medicine

## 2016-07-12 VITALS — BP 112/80 | HR 71 | Temp 98.2°F | Ht 64.5 in | Wt 168.6 lb

## 2016-07-12 DIAGNOSIS — H01133 Eczematous dermatitis of right eye, unspecified eyelid: Secondary | ICD-10-CM | POA: Diagnosis not present

## 2016-07-12 DIAGNOSIS — H8110 Benign paroxysmal vertigo, unspecified ear: Secondary | ICD-10-CM | POA: Diagnosis not present

## 2016-07-12 MED ORDER — DESONIDE 0.05 % EX CREA: TOPICAL_CREAM | CUTANEOUS | 0 refills | Status: DC

## 2016-07-12 MED ORDER — MECLIZINE HCL 25 MG PO TABS: 25.0000 mg | ORAL_TABLET | Freq: Three times a day (TID) | ORAL | 0 refills | Status: DC | PRN

## 2016-07-12 NOTE — Patient Instructions (Addendum)
Use the desonide cream sparingly in your eyelids for 4-5 days as needed. Do not use for a prolonged period of time! Use the meclizine as needed for vertigo- remember it will make you sleepy Let me know if either issue is not improved soon
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
## 2016-08-02 ENCOUNTER — Ambulatory Visit
Admission: RE | Admit: 2016-08-02 | Discharge: 2016-08-02 | Disposition: A | Discharge status: Home / Self Care | Payer: BLUE CROSS/BLUE SHIELD | Source: Ambulatory Visit | Attending: Obstetrics and Gynecology | Admitting: Obstetrics and Gynecology

## 2016-08-02 DIAGNOSIS — Z1231 Encounter for screening mammogram for malignant neoplasm of breast: Secondary | ICD-10-CM

## 2016-08-06 ENCOUNTER — Other Ambulatory Visit (INDEPENDENT_AMBULATORY_CARE_PROVIDER_SITE_OTHER): Payer: BLUE CROSS/BLUE SHIELD

## 2016-08-06 ENCOUNTER — Encounter: Payer: Self-pay | Admitting: Internal Medicine

## 2016-08-06 ENCOUNTER — Ambulatory Visit (INDEPENDENT_AMBULATORY_CARE_PROVIDER_SITE_OTHER): Payer: BLUE CROSS/BLUE SHIELD | Admitting: Internal Medicine

## 2016-08-06 ENCOUNTER — Other Ambulatory Visit: Payer: Self-pay | Admitting: Internal Medicine

## 2016-08-06 VITALS — BP 128/82 | HR 59 | Temp 98.1°F | Resp 20 | Wt 168.0 lb

## 2016-08-06 DIAGNOSIS — Z0000 Encounter for general adult medical examination without abnormal findings: Secondary | ICD-10-CM

## 2016-08-06 DIAGNOSIS — R42 Dizziness and giddiness: Secondary | ICD-10-CM

## 2016-08-06 DIAGNOSIS — D6859 Other primary thrombophilia: Secondary | ICD-10-CM

## 2016-08-06 DIAGNOSIS — Z23 Encounter for immunization: Secondary | ICD-10-CM | POA: Diagnosis not present

## 2016-08-06 DIAGNOSIS — D682 Hereditary deficiency of other clotting factors: Secondary | ICD-10-CM

## 2016-08-06 LAB — URINALYSIS
BILIRUBIN URINE: NEGATIVE
HGB URINE DIPSTICK: NEGATIVE
KETONES UR: NEGATIVE
LEUKOCYTES UA: NEGATIVE
NITRITE: NEGATIVE
Specific Gravity, Urine: 1.025 (ref 1.000–1.030)
Total Protein, Urine: NEGATIVE
Urine Glucose: NEGATIVE
Urobilinogen, UA: 0.2 (ref 0.0–1.0)
pH: 5.5 (ref 5.0–8.0)

## 2016-08-06 LAB — CBC WITH DIFFERENTIAL/PLATELET
BASOS PCT: 0.3 % (ref 0.0–3.0)
Basophils Absolute: 0 10*3/uL (ref 0.0–0.1)
EOS PCT: 4.7 % (ref 0.0–5.0)
Eosinophils Absolute: 0.2 10*3/uL (ref 0.0–0.7)
HCT: 37.4 % (ref 36.0–46.0)
HEMOGLOBIN: 12.7 g/dL (ref 12.0–15.0)
Lymphocytes Relative: 30 % (ref 12.0–46.0)
Lymphs Abs: 1.4 10*3/uL (ref 0.7–4.0)
MCHC: 33.9 g/dL (ref 30.0–36.0)
MCV: 92.7 fl (ref 78.0–100.0)
MONO ABS: 0.5 10*3/uL (ref 0.1–1.0)
MONOS PCT: 9.6 % (ref 3.0–12.0)
Neutro Abs: 2.6 10*3/uL (ref 1.4–7.7)
Neutrophils Relative %: 55.4 % (ref 43.0–77.0)
Platelets: 252 10*3/uL (ref 150.0–400.0)
RBC: 4.04 Mil/uL (ref 3.87–5.11)
RDW: 13.1 % (ref 11.5–15.5)
WBC: 4.8 10*3/uL (ref 4.0–10.5)

## 2016-08-06 LAB — LIPID PANEL
Cholesterol: 152 mg/dL (ref 0–200)
HDL: 67.8 mg/dL (ref >39.00)
LDL Cholesterol: 72 mg/dL (ref 0–99)
NONHDL: 84.56
Total CHOL/HDL Ratio: 2
Triglycerides: 63 mg/dL (ref 0.0–149.0)
VLDL: 12.6 mg/dL (ref 0.0–40.0)

## 2016-08-06 LAB — BASIC METABOLIC PANEL
BUN: 12 mg/dL (ref 6–23)
CHLORIDE: 105 meq/L (ref 96–112)
CO2: 27 mEq/L (ref 19–32)
Calcium: 8.6 mg/dL (ref 8.4–10.5)
Creatinine, Ser: 0.72 mg/dL (ref 0.40–1.20)
GFR: 90.88 mL/min (ref >60.00)
GLUCOSE: 103 mg/dL — AB (ref 70–99)
POTASSIUM: 4.1 meq/L (ref 3.5–5.1)
SODIUM: 137 meq/L (ref 135–145)

## 2016-08-06 LAB — VITAMIN D 25 HYDROXY (VIT D DEFICIENCY, FRACTURES): VITD: 27.8 ng/mL — AB (ref 30.00–100.00)

## 2016-08-06 LAB — HEPATIC FUNCTION PANEL
ALK PHOS: 64 U/L (ref 39–117)
ALT: 17 U/L (ref 0–35)
AST: 17 U/L (ref 0–37)
Albumin: 4 g/dL (ref 3.5–5.2)
BILIRUBIN DIRECT: 0.1 mg/dL (ref 0.0–0.3)
BILIRUBIN TOTAL: 0.5 mg/dL (ref 0.2–1.2)
Total Protein: 6.6 g/dL (ref 6.0–8.3)

## 2016-08-06 LAB — VITAMIN B12: VITAMIN B 12: 442 pg/mL (ref 211–911)

## 2016-08-06 LAB — TSH: TSH: 2.16 u[IU]/mL (ref 0.35–4.50)

## 2016-08-06 MED ORDER — VITAMIN D3 25 MCG (1000 UNIT) PO TABS: 2000.0000 [IU] | ORAL_TABLET | Freq: Every day | ORAL | 3 refills | Status: DC

## 2016-08-06 NOTE — Assessment & Plan Note (Signed)
We discussed age appropriate health related issues, including available/recomended screening tests and vaccinations. We discussed a need for adhering to healthy diet and exercise. Labs/EKG were reviewed/ordered. All questions were answered.   

## 2016-08-06 NOTE — Progress Notes (Signed)
Pre visit review using our clinic review tool, if applicable. No additional management support is needed unless otherwise documented below in the visit note. 

## 2016-08-06 NOTE — Addendum Note (Signed)
Addended by: Cresenciano Lick on: 08/06/2016 11:28 AM   Modules accepted: Orders

## 2016-08-06 NOTE — Assessment & Plan Note (Signed)
chronic occ dizzy spells - 1 month -(a moving forward sensation)- s/p ENT eval 3 years ago Neurol ref Labs MRI brain discussed

## 2016-08-06 NOTE — Progress Notes (Signed)
Subjective:  Patient ID: Jody Ford, female    DOB: [birth year]  Age: 50 y.o. MRN: [id number]  CC: No chief complaint on file.   HPI Jody Ford presents for a well exam C/o chronic occ dizzy spells - 1 month -(a moving forward sensation)- s/p ENT eval 3 years ago  Outpatient Medications Prior to Visit  Medication Sig Dispense Refill  . aspirin 81 MG tablet Take 81 mg by mouth daily.    . cholecalciferol (VITAMIN D) 1000 UNITS tablet Take 1,000 Units by mouth daily.      . meclizine (ANTIVERT) 25 MG tablet Take 1 tablet (25 mg total) by mouth 3 (three) times daily as needed for dizziness. 30 tablet 0  . Multiple Vitamin (MULTIVITAMIN) tablet Take 1 tablet by mouth daily.    . pentoxifylline (TRENTAL) 400 MG CR tablet Take 400 mg by mouth 3 (three) times daily with meals.    Marland Kitchen desonide (DESOWEN) 0.05 % cream Apply 1-2x daily as needed for itchy or rough skin.  Do not use for more than a few days at a time 15 g 0  . guaiFENesin-codeine (CHERATUSSIN AC) 100-10 MG/5ML syrup Take 5 mLs by mouth 3 (three) times daily as needed for cough. 120 mL 0   No facility-administered medications prior to visit.     ROS Review of Systems  Constitutional: Negative for activity change, appetite change, chills, fatigue and unexpected weight change.  HENT: Negative for congestion, mouth sores and sinus pressure.   Eyes: Negative for visual disturbance.  Respiratory: Negative for cough and chest tightness.   Gastrointestinal: Negative for abdominal pain and nausea.  Genitourinary: Negative for difficulty urinating, frequency and vaginal pain.  Musculoskeletal: Negative for back pain and gait problem.  Skin: Negative for pallor and rash.  Neurological: Positive for dizziness. Negative for tremors, weakness, numbness and headaches.  Psychiatric/Behavioral: Negative for confusion and sleep disturbance.    Objective:  BP 128/82   Pulse (!) 59   Temp 98.1 F (36.7 C) (Oral)   Resp 20    Wt 168 lb (76.2 kg)   LMP 04/11/2016   SpO2 97%   BMI 28.39 kg/m   BP Readings from Last 3 Encounters:  08/06/16 128/82  07/12/16 112/80  10/31/15 109/62    Wt Readings from Last 3 Encounters:  08/06/16 168 lb (76.2 kg)  07/12/16 168 lb 9.6 oz (76.5 kg)  10/31/15 164 lb 12.8 oz (74.8 kg)    Physical Exam  Constitutional: She appears well-developed. No distress.  HENT:  Head: Normocephalic.  Right Ear: External ear normal.  Left Ear: External ear normal.  Nose: Nose normal.  Mouth/Throat: Oropharynx is clear and moist.  Eyes: Conjunctivae are normal. Pupils are equal, round, and reactive to light. Right eye exhibits no discharge. Left eye exhibits no discharge.  Neck: Normal range of motion. Neck supple. No JVD present. No tracheal deviation present. No thyromegaly present.  Cardiovascular: Normal rate, regular rhythm and normal heart sounds.   Pulmonary/Chest: No stridor. No respiratory distress. She has no wheezes.  Abdominal: Soft. Bowel sounds are normal. She exhibits no distension and no mass. There is no tenderness. There is no rebound and no guarding.  Musculoskeletal: She exhibits no edema or tenderness.  Lymphadenopathy:    She has no cervical adenopathy.  Neurological: She displays normal reflexes. No cranial nerve deficit. She exhibits normal muscle tone. Coordination normal.  Skin: No rash noted. No erythema.  Psychiatric: She has a normal mood and affect. Her  behavior is normal. Judgment and thought content normal.    Lab Results  Component Value Date   WBC 4.9 09/17/2013   HGB 12.8 09/17/2013   HCT 37.5 09/17/2013   PLT 233.0 09/17/2013   GLUCOSE 110 (H) 09/17/2013   CHOL 165 09/17/2013   TRIG 24.0 09/17/2013   HDL 81.40 09/17/2013   LDLCALC 79 09/17/2013   ALT 19 09/17/2013   AST 18 09/17/2013   NA 138 09/17/2013   K 3.8 09/17/2013   CL 104 09/17/2013   CREATININE 0.7 09/17/2013   BUN 14 09/17/2013   CO2 25 09/17/2013   TSH 1.69 09/17/2013    INR 1.1 (H) 09/17/2013   HGBA1C 5.8 06/17/2009    Mm Screening Breast Tomo Bilateral  Result Date: 08/02/2016 CLINICAL DATA:  Screening. EXAM: 2D DIGITAL SCREENING BILATERAL MAMMOGRAM WITH CAD AND ADJUNCT TOMO COMPARISON:  Previous exam(s). ACR Breast Density Category c: The breast tissue is heterogeneously dense, which may obscure small masses. FINDINGS: There are no findings suspicious for malignancy. Images were processed with CAD. IMPRESSION: No mammographic evidence of malignancy. A result letter of this screening mammogram will be mailed directly to the patient. RECOMMENDATION: Screening mammogram in one year. (Code:SM-B-01Y) BI-RADS CATEGORY  1: Negative. Electronically Signed   By: Nolon Nations M.D.   On: 08/02/2016 14:38    Assessment & Plan:   There are no diagnoses linked to this encounter. I have discontinued Ms. Stettner's guaiFENesin-codeine and desonide. I am also having her maintain her cholecalciferol, aspirin, multivitamin, pentoxifylline, and meclizine.  No orders of the defined types were placed in this encounter.    Follow-up: No Follow-up on file.  Walker Kehr, MD

## 2016-08-06 NOTE — Assessment & Plan Note (Signed)
ASA 81 mg/d 

## 2016-08-06 NOTE — Patient Instructions (Signed)
Ergonomic work station [employer]

## 2017-07-05 ENCOUNTER — Other Ambulatory Visit: Payer: Self-pay | Admitting: Obstetrics and Gynecology

## 2017-07-05 DIAGNOSIS — Z1231 Encounter for screening mammogram for malignant neoplasm of breast: Secondary | ICD-10-CM

## 2017-08-05 ENCOUNTER — Encounter: Payer: Self-pay | Admitting: Internal Medicine

## 2017-08-05 ENCOUNTER — Ambulatory Visit
Admission: RE | Admit: 2017-08-05 | Discharge: 2017-08-05 | Disposition: A | Discharge status: Home / Self Care | Payer: BLUE CROSS/BLUE SHIELD | Source: Ambulatory Visit | Attending: Obstetrics and Gynecology | Admitting: Obstetrics and Gynecology

## 2017-08-05 ENCOUNTER — Ambulatory Visit (INDEPENDENT_AMBULATORY_CARE_PROVIDER_SITE_OTHER): Payer: BLUE CROSS/BLUE SHIELD | Admitting: Internal Medicine

## 2017-08-05 ENCOUNTER — Other Ambulatory Visit (INDEPENDENT_AMBULATORY_CARE_PROVIDER_SITE_OTHER): Payer: BLUE CROSS/BLUE SHIELD

## 2017-08-05 VITALS — BP 122/70 | HR 68 | Temp 98.8°F | Ht 64.5 in | Wt 169.0 lb

## 2017-08-05 DIAGNOSIS — E559 Vitamin D deficiency, unspecified: Secondary | ICD-10-CM

## 2017-08-05 DIAGNOSIS — Z86718 Personal history of other venous thrombosis and embolism: Secondary | ICD-10-CM

## 2017-08-05 DIAGNOSIS — Z0000 Encounter for general adult medical examination without abnormal findings: Secondary | ICD-10-CM

## 2017-08-05 DIAGNOSIS — I872 Venous insufficiency (chronic) (peripheral): Secondary | ICD-10-CM | POA: Diagnosis not present

## 2017-08-05 DIAGNOSIS — R7309 Other abnormal glucose: Secondary | ICD-10-CM | POA: Diagnosis not present

## 2017-08-05 DIAGNOSIS — Z1231 Encounter for screening mammogram for malignant neoplasm of breast: Secondary | ICD-10-CM

## 2017-08-05 DIAGNOSIS — Z23 Encounter for immunization: Secondary | ICD-10-CM | POA: Diagnosis not present

## 2017-08-05 LAB — HEPATIC FUNCTION PANEL
ALT: 19 U/L (ref 0–35)
AST: 16 U/L (ref 0–37)
Albumin: 4.4 g/dL (ref 3.5–5.2)
Alkaline Phosphatase: 67 U/L (ref 39–117)
BILIRUBIN DIRECT: 0.2 mg/dL (ref 0.0–0.3)
BILIRUBIN TOTAL: 0.4 mg/dL (ref 0.2–1.2)
Total Protein: 7 g/dL (ref 6.0–8.3)

## 2017-08-05 LAB — BASIC METABOLIC PANEL
BUN: 15 mg/dL (ref 6–23)
CALCIUM: 9.3 mg/dL (ref 8.4–10.5)
CO2: 28 meq/L (ref 19–32)
CREATININE: 0.81 mg/dL (ref 0.40–1.20)
Chloride: 106 mEq/L (ref 96–112)
GFR: 79.02 mL/min (ref >60.00)
Glucose, Bld: 115 mg/dL — ABNORMAL HIGH (ref 70–99)
Potassium: 3.8 mEq/L (ref 3.5–5.1)
SODIUM: 140 meq/L (ref 135–145)

## 2017-08-05 LAB — LIPID PANEL
Cholesterol: 147 mg/dL (ref 0–200)
HDL: 69.4 mg/dL (ref >39.00)
LDL Cholesterol: 69 mg/dL (ref 0–99)
NONHDL: 77.84
TRIGLYCERIDES: 43 mg/dL (ref 0.0–149.0)
Total CHOL/HDL Ratio: 2
VLDL: 8.6 mg/dL (ref 0.0–40.0)

## 2017-08-05 LAB — URINALYSIS
Bilirubin Urine: NEGATIVE
Hgb urine dipstick: NEGATIVE
KETONES UR: NEGATIVE
Leukocytes, UA: NEGATIVE
NITRITE: NEGATIVE
PH: 5.5 (ref 5.0–8.0)
Total Protein, Urine: NEGATIVE
UROBILINOGEN UA: 0.2 (ref 0.0–1.0)
Urine Glucose: NEGATIVE

## 2017-08-05 LAB — CBC WITH DIFFERENTIAL/PLATELET
BASOS ABS: 0 10*3/uL (ref 0.0–0.1)
Basophils Relative: 0.5 % (ref 0.0–3.0)
EOS ABS: 0.2 10*3/uL (ref 0.0–0.7)
Eosinophils Relative: 3.9 % (ref 0.0–5.0)
HEMATOCRIT: 39.8 % (ref 36.0–46.0)
HEMOGLOBIN: 13 g/dL (ref 12.0–15.0)
LYMPHS PCT: 26.9 % (ref 12.0–46.0)
Lymphs Abs: 1.4 10*3/uL (ref 0.7–4.0)
MCHC: 32.7 g/dL (ref 30.0–36.0)
MCV: 94.6 fl (ref 78.0–100.0)
MONOS PCT: 8 % (ref 3.0–12.0)
Monocytes Absolute: 0.4 10*3/uL (ref 0.1–1.0)
Neutro Abs: 3.2 10*3/uL (ref 1.4–7.7)
Neutrophils Relative %: 60.7 % (ref 43.0–77.0)
Platelets: 279 10*3/uL (ref 150.0–400.0)
RBC: 4.21 Mil/uL (ref 3.87–5.11)
RDW: 13.2 % (ref 11.5–15.5)
WBC: 5.2 10*3/uL (ref 4.0–10.5)

## 2017-08-05 LAB — TSH: TSH: 1.72 u[IU]/mL (ref 0.35–4.50)

## 2017-08-05 MED ORDER — VITAMIN D3 25 MCG (1000 UNIT) PO TABS: 2000.0000 [IU] | ORAL_TABLET | Freq: Every day | ORAL | 3 refills | Status: DC

## 2017-08-05 MED ORDER — VASCULERA PO TABS: 1.0000 | ORAL_TABLET | Freq: Every day | ORAL | 3 refills | Status: DC

## 2017-08-05 NOTE — Assessment & Plan Note (Signed)
[facility]

## 2017-08-05 NOTE — Progress Notes (Signed)
Subjective:  Patient ID: Jody Ford, female    DOB: 10-03-1966  Age: 51 y.o. MRN: [id number]  CC: No chief complaint on file.   HPI LAKARA WEILAND presents for a well exam C/o venous insufficiency  Outpatient Medications Prior to Visit  Medication Sig Dispense Refill  . aspirin 81 MG tablet Take 81 mg by mouth daily.    . cholecalciferol (VITAMIN D) 1000 units tablet Take 2 tablets (2,000 Units total) by mouth daily. 100 tablet 3  . meclizine (ANTIVERT) 25 MG tablet Take 1 tablet (25 mg total) by mouth 3 (three) times daily as needed for dizziness. 30 tablet 0  . Multiple Vitamin (MULTIVITAMIN) tablet Take 1 tablet by mouth daily.    . pentoxifylline (TRENTAL) 400 MG CR tablet Take 400 mg by mouth 3 (three) times daily with meals.     No facility-administered medications prior to visit.     ROS Review of Systems  Constitutional: Negative for activity change, appetite change, chills, fatigue and unexpected weight change.  HENT: Negative for congestion, mouth sores and sinus pressure.   Eyes: Negative for visual disturbance.  Respiratory: Negative for cough and chest tightness.   Gastrointestinal: Negative for abdominal pain and nausea.  Genitourinary: Negative for difficulty urinating, frequency and vaginal pain.  Musculoskeletal: Negative for back pain and gait problem.  Skin: Positive for color change and wound. Negative for pallor and rash.  Neurological: Negative for dizziness, tremors, weakness, numbness and headaches.  Psychiatric/Behavioral: Negative for confusion and sleep disturbance.    Objective:  BP 122/70 (BP Location: Left Arm, Patient Position: Sitting, Cuff Size: Large)   Pulse 68   Temp 98.8 F (37.1 C) (Oral)   Ht 5' 4.5" (1.638 m)   Wt 169 lb (76.7 kg)   SpO2 98%   BMI 28.56 kg/m   BP Readings from Last 3 Encounters:  08/05/17 122/70  08/06/16 128/82  07/12/16 112/80    Wt Readings from Last 3 Encounters:  08/05/17 169 lb (76.7 kg)    08/06/16 168 lb (76.2 kg)  07/12/16 168 lb 9.6 oz (76.5 kg)    Physical Exam  Constitutional: She appears well-developed. No distress.  HENT:  Head: Normocephalic.  Right Ear: External ear normal.  Left Ear: External ear normal.  Nose: Nose normal.  Mouth/Throat: Oropharynx is clear and moist.  Eyes: Pupils are equal, round, and reactive to light. Conjunctivae are normal. Right eye exhibits no discharge. Left eye exhibits no discharge.  Neck: Normal range of motion. Neck supple. No JVD present. No tracheal deviation present. No thyromegaly present.  Cardiovascular: Normal rate, regular rhythm and normal heart sounds.   Pulmonary/Chest: No stridor. No respiratory distress. She has no wheezes.  Abdominal: Soft. Bowel sounds are normal. She exhibits no distension and no mass. There is no tenderness. There is no rebound and no guarding.  Musculoskeletal: She exhibits edema. She exhibits no tenderness.  Lymphadenopathy:    She has no cervical adenopathy.  Neurological: She displays normal reflexes. No cranial nerve deficit. She exhibits normal muscle tone. Coordination normal.  Skin: No rash noted. No erythema.  Psychiatric: She has a normal mood and affect. Her behavior is normal. Judgment and thought content normal.   venous insufficiency LLE  Lab Results  Component Value Date   WBC 4.8 08/06/2016   HGB 12.7 08/06/2016   HCT 37.4 08/06/2016   PLT 252.0 08/06/2016   GLUCOSE 103 (H) 08/06/2016   CHOL 152 08/06/2016   TRIG 63.0 08/06/2016  HDL 67.80 08/06/2016   LDLCALC 72 08/06/2016   ALT 17 08/06/2016   AST 17 08/06/2016   NA 137 08/06/2016   K 4.1 08/06/2016   CL 105 08/06/2016   CREATININE 0.72 08/06/2016   BUN 12 08/06/2016   CO2 27 08/06/2016   TSH 2.16 08/06/2016   INR 1.1 (H) 09/17/2013   HGBA1C 5.8 06/17/2009    Mm Screening Breast Tomo Bilateral  Result Date: 08/05/2017 CLINICAL DATA:  Screening. EXAM: 2D DIGITAL SCREENING BILATERAL MAMMOGRAM WITH CAD AND  ADJUNCT TOMO COMPARISON:  Previous exam(s). ACR Breast Density Category c: The breast tissue is heterogeneously dense, which may obscure small masses. FINDINGS: There are no findings suspicious for malignancy. Images were processed with CAD. IMPRESSION: No mammographic evidence of malignancy. A result letter of this screening mammogram will be mailed directly to the patient. RECOMMENDATION: Screening mammogram in one year. (Code:SM-B-01Y) BI-RADS CATEGORY  1: Negative. Electronically Signed   By: Ammie Ferrier M.D.   On: 08/05/2017 08:47    Assessment & Plan:   There are no diagnoses linked to this encounter. I am having Ms. Neyra start on [place]. I am also having her maintain her aspirin, multivitamin, pentoxifylline, meclizine, and cholecalciferol.  Meds ordered this encounter  Medications  . Dietary Management Product (VASCULERA) TABS    Sig: Take 1 tablet by mouth daily.    Dispense:  90 tablet    Refill:  3     Follow-up: No Follow-up on file.  Walker Kehr, MD

## 2017-08-05 NOTE — Assessment & Plan Note (Signed)
Labs

## 2017-08-05 NOTE — Assessment & Plan Note (Signed)
venous insufficiency LLE

## 2018-05-22 ENCOUNTER — Other Ambulatory Visit: Payer: Self-pay | Admitting: Obstetrics and Gynecology

## 2018-05-22 DIAGNOSIS — Z1231 Encounter for screening mammogram for malignant neoplasm of breast: Secondary | ICD-10-CM

## 2018-08-07 ENCOUNTER — Ambulatory Visit
Admission: RE | Admit: 2018-08-07 | Discharge: 2018-08-07 | Disposition: A | Discharge status: Home / Self Care | Payer: BLUE CROSS/BLUE SHIELD | Source: Ambulatory Visit | Attending: Obstetrics and Gynecology | Admitting: Obstetrics and Gynecology

## 2018-08-07 DIAGNOSIS — Z1231 Encounter for screening mammogram for malignant neoplasm of breast: Secondary | ICD-10-CM

## 2018-08-12 ENCOUNTER — Other Ambulatory Visit (INDEPENDENT_AMBULATORY_CARE_PROVIDER_SITE_OTHER): Payer: BLUE CROSS/BLUE SHIELD

## 2018-08-12 ENCOUNTER — Encounter: Payer: Self-pay | Admitting: Internal Medicine

## 2018-08-12 ENCOUNTER — Ambulatory Visit (INDEPENDENT_AMBULATORY_CARE_PROVIDER_SITE_OTHER): Payer: BLUE CROSS/BLUE SHIELD | Admitting: Internal Medicine

## 2018-08-12 VITALS — BP 120/70 | HR 70 | Ht 64.5 in | Wt 174.0 lb

## 2018-08-12 DIAGNOSIS — Z0000 Encounter for general adult medical examination without abnormal findings: Secondary | ICD-10-CM

## 2018-08-12 DIAGNOSIS — Z23 Encounter for immunization: Secondary | ICD-10-CM | POA: Diagnosis not present

## 2018-08-12 DIAGNOSIS — Z1211 Encounter for screening for malignant neoplasm of colon: Secondary | ICD-10-CM

## 2018-08-12 LAB — CBC WITH DIFFERENTIAL/PLATELET
BASOS ABS: 0.1 10*3/uL (ref 0.0–0.1)
BASOS PCT: 1.1 % (ref 0.0–3.0)
Eosinophils Absolute: 0.2 10*3/uL (ref 0.0–0.7)
Eosinophils Relative: 5.5 % — ABNORMAL HIGH (ref 0.0–5.0)
HEMATOCRIT: 38.6 % (ref 36.0–46.0)
Hemoglobin: 12.8 g/dL (ref 12.0–15.0)
LYMPHS PCT: 30.6 % (ref 12.0–46.0)
Lymphs Abs: 1.4 10*3/uL (ref 0.7–4.0)
MCHC: 33.1 g/dL (ref 30.0–36.0)
MCV: 92.9 fl (ref 78.0–100.0)
MONOS PCT: 10.4 % (ref 3.0–12.0)
Monocytes Absolute: 0.5 10*3/uL (ref 0.1–1.0)
NEUTROS ABS: 2.3 10*3/uL (ref 1.4–7.7)
Neutrophils Relative %: 52.4 % (ref 43.0–77.0)
PLATELETS: 257 10*3/uL (ref 150.0–400.0)
RBC: 4.15 Mil/uL (ref 3.87–5.11)
RDW: 13 % (ref 11.5–15.5)
WBC: 4.5 10*3/uL (ref 4.0–10.5)

## 2018-08-12 LAB — LIPID PANEL
CHOLESTEROL: 137 mg/dL (ref 0–200)
HDL: 63.6 mg/dL (ref >39.00)
LDL Cholesterol: 62 mg/dL (ref 0–99)
NonHDL: 73.4
TRIGLYCERIDES: 56 mg/dL (ref 0.0–149.0)
Total CHOL/HDL Ratio: 2
VLDL: 11.2 mg/dL (ref 0.0–40.0)

## 2018-08-12 LAB — HEPATIC FUNCTION PANEL
ALBUMIN: 4.2 g/dL (ref 3.5–5.2)
ALT: 19 U/L (ref 0–35)
AST: 17 U/L (ref 0–37)
Alkaline Phosphatase: 76 U/L (ref 39–117)
Bilirubin, Direct: 0.1 mg/dL (ref 0.0–0.3)
TOTAL PROTEIN: 6.7 g/dL (ref 6.0–8.3)
Total Bilirubin: 0.3 mg/dL (ref 0.2–1.2)

## 2018-08-12 LAB — BASIC METABOLIC PANEL
BUN: 19 mg/dL (ref 6–23)
CHLORIDE: 105 meq/L (ref 96–112)
CO2: 25 mEq/L (ref 19–32)
CREATININE: 0.85 mg/dL (ref 0.40–1.20)
Calcium: 9.2 mg/dL (ref 8.4–10.5)
GFR: 74.45 mL/min (ref >60.00)
GLUCOSE: 111 mg/dL — AB (ref 70–99)
POTASSIUM: 3.9 meq/L (ref 3.5–5.1)
Sodium: 139 mEq/L (ref 135–145)

## 2018-08-12 LAB — URINALYSIS
Bilirubin Urine: NEGATIVE
Hgb urine dipstick: NEGATIVE
Ketones, ur: NEGATIVE
LEUKOCYTES UA: NEGATIVE
NITRITE: NEGATIVE
PH: 5.5 (ref 5.0–8.0)
Total Protein, Urine: NEGATIVE
Urine Glucose: NEGATIVE
Urobilinogen, UA: 0.2 (ref 0.0–1.0)

## 2018-08-12 LAB — TSH: TSH: 2.16 u[IU]/mL (ref 0.35–4.50)

## 2018-08-12 MED ORDER — PENTOXIFYLLINE ER 400 MG PO TBCR: 400.0000 mg | EXTENDED_RELEASE_TABLET | Freq: Three times a day (TID) | ORAL | 11 refills | Status: DC

## 2018-08-12 NOTE — Assessment & Plan Note (Signed)
We discussed age appropriate health related issues, including available/recomended screening tests and vaccinations. We discussed a need for adhering to healthy diet and exercise. Labs ordered. All questions were answered. PAP per GYN Colonoscopy - will sch Shingrix discussed

## 2018-08-12 NOTE — Progress Notes (Signed)
Subjective:  Patient ID: Jody Ford, female    DOB: [birth year]  Age: 52 y.o. MRN: [id number]  CC: No chief complaint on file.   HPI Jody Ford presents for a well exam  Outpatient Medications Prior to Visit  Medication Sig Dispense Refill  . aspirin 81 MG tablet Take 81 mg by mouth daily.    . cholecalciferol (VITAMIN D) 1000 units tablet Take 2 tablets (2,000 Units total) by mouth daily. 100 tablet 3  . Multiple Vitamin (MULTIVITAMIN) tablet Take 1 tablet by mouth daily.    . pentoxifylline (TRENTAL) 400 MG CR tablet Take 400 mg by mouth 3 (three) times daily with meals.    . Dietary Management Product (VASCULERA) TABS Take 1 tablet by mouth daily. 90 tablet 3  . meclizine (ANTIVERT) 25 MG tablet Take 1 tablet (25 mg total) by mouth 3 (three) times daily as needed for dizziness. 30 tablet 0   No facility-administered medications prior to visit.     ROS: Review of Systems  Constitutional: Negative for activity change, appetite change, chills, fatigue and unexpected weight change.  HENT: Negative for congestion, mouth sores and sinus pressure.   Eyes: Negative for visual disturbance.  Respiratory: Negative for cough and chest tightness.   Gastrointestinal: Negative for abdominal pain and nausea.  Genitourinary: Negative for difficulty urinating, frequency and vaginal pain.  Musculoskeletal: Negative for back pain and gait problem.  Skin: Negative for pallor and rash.  Neurological: Negative for dizziness, tremors, weakness, numbness and headaches.  Psychiatric/Behavioral: Negative for confusion, sleep disturbance and suicidal ideas.    Objective:  BP 120/70 (BP Location: Left Arm, Patient Position: Sitting, Cuff Size: Large)   Pulse 70   Ht 5' 4.5" (1.638 m)   Wt 174 lb (78.9 kg)   SpO2 98%   BMI 29.41 kg/m   BP Readings from Last 3 Encounters:  08/12/18 120/70  08/05/17 122/70  08/06/16 128/82    Wt Readings from Last 3 Encounters:  08/12/18 174 lb  (78.9 kg)  08/05/17 169 lb (76.7 kg)  08/06/16 168 lb (76.2 kg)    Physical Exam  Constitutional: She appears well-developed. No distress.  HENT:  Head: Normocephalic.  Right Ear: External ear normal.  Left Ear: External ear normal.  Nose: Nose normal.  Mouth/Throat: Oropharynx is clear and moist.  Eyes: Pupils are equal, round, and reactive to light. Conjunctivae are normal. Right eye exhibits no discharge. Left eye exhibits no discharge.  Neck: Normal range of motion. Neck supple. No JVD present. No tracheal deviation present. No thyromegaly present.  Cardiovascular: Normal rate, regular rhythm and normal heart sounds.  Pulmonary/Chest: No stridor. No respiratory distress. She has no wheezes.  Abdominal: Soft. Bowel sounds are normal. She exhibits no distension and no mass. There is no tenderness. There is no rebound and no guarding.  Musculoskeletal: She exhibits no edema or tenderness.  Lymphadenopathy:    She has no cervical adenopathy.  Neurological: She displays normal reflexes. No cranial nerve deficit. She exhibits normal muscle tone. Coordination normal.  Skin: No rash noted. No erythema.  Psychiatric: She has a normal mood and affect. Her behavior is normal. Judgment and thought content normal.    Lab Results  Component Value Date   WBC 5.2 08/05/2017   HGB 13.0 08/05/2017   HCT 39.8 08/05/2017   PLT 279.0 08/05/2017   GLUCOSE 115 (H) 08/05/2017   CHOL 147 08/05/2017   TRIG 43.0 08/05/2017   HDL 69.40 08/05/2017   LDLCALC 69  08/05/2017   ALT 19 08/05/2017   AST 16 08/05/2017   NA 140 08/05/2017   K 3.8 08/05/2017   CL 106 08/05/2017   CREATININE 0.81 08/05/2017   BUN 15 08/05/2017   CO2 28 08/05/2017   TSH 1.72 08/05/2017   INR 1.1 (H) 09/17/2013   HGBA1C 5.8 06/17/2009    Mm 3d Screen Breast Bilateral  Result Date: 08/07/2018 CLINICAL DATA:  Screening. EXAM: DIGITAL SCREENING BILATERAL MAMMOGRAM WITH TOMO AND CAD COMPARISON:  Previous exam(s). ACR  Breast Density Category c: The breast tissue is heterogeneously dense, which may obscure small masses. FINDINGS: There are no findings suspicious for malignancy. Images were processed with CAD. IMPRESSION: No mammographic evidence of malignancy. A result letter of this screening mammogram will be mailed directly to the patient. RECOMMENDATION: Screening mammogram in one year. (Code:SM-B-01Y) BI-RADS CATEGORY  1: Negative. Electronically Signed   By: Claudie Revering M.D.   On: 08/07/2018 12:51    Assessment & Plan:   Diagnoses and all orders for this visit:  Need for influenza vaccination -     Flu Vaccine QUAD 36+ mos IM     No orders of the defined types were placed in this encounter.    Follow-up: No follow-ups on file.  Walker Kehr, MD

## 2018-08-12 NOTE — Patient Instructions (Signed)
Shingrix vaccine

## 2018-09-11 ENCOUNTER — Encounter: Payer: Self-pay | Admitting: Gastroenterology

## 2018-10-06 ENCOUNTER — Encounter: Payer: Self-pay | Admitting: Gastroenterology

## 2018-10-06 ENCOUNTER — Ambulatory Visit (AMBULATORY_SURGERY_CENTER): Payer: Self-pay

## 2018-10-06 VITALS — Ht 64.0 in | Wt 174.0 lb

## 2018-10-06 DIAGNOSIS — Z1211 Encounter for screening for malignant neoplasm of colon: Secondary | ICD-10-CM

## 2018-10-06 MED ORDER — NA SULFATE-K SULFATE-MG SULF 17.5-3.13-1.6 GM/177ML PO SOLN: 1.0000 | Freq: Once | ORAL | 0 refills | Status: AC

## 2018-10-06 NOTE — Progress Notes (Signed)
Denies allergies to eggs or soy products. Denies complication of anesthesia or sedation. Denies use of weight loss medication. Denies use of O2.   Emmi instructions declined.   Patient was instructed to drink a lot ( 8 or more glasses ) of clear liquids prior to her procedure. I explained that this would help her prep to work more efficiently, it would help [place] get an IV started, and she would feel better throughout the day before.

## 2018-10-20 ENCOUNTER — Encounter: Payer: Self-pay | Admitting: Gastroenterology

## 2018-10-20 ENCOUNTER — Ambulatory Visit (AMBULATORY_SURGERY_CENTER): Payer: BLUE CROSS/BLUE SHIELD | Admitting: Gastroenterology

## 2018-10-20 VITALS — BP 100/46 | HR 68 | Temp 97.8°F | Resp 12 | Ht 64.0 in | Wt 174.0 lb

## 2018-10-20 DIAGNOSIS — D120 Benign neoplasm of cecum: Secondary | ICD-10-CM

## 2018-10-20 DIAGNOSIS — Z1211 Encounter for screening for malignant neoplasm of colon: Secondary | ICD-10-CM | POA: Diagnosis present

## 2018-10-20 MED ORDER — SODIUM CHLORIDE 0.9 % IV SOLN: 500.0000 mL | Freq: Once | INTRAVENOUS | Status: DC

## 2018-10-20 NOTE — Progress Notes (Signed)
Pt's states no medical or surgical changes since previsit or office visit. 

## 2018-10-20 NOTE — Progress Notes (Signed)
Called to room to assist during endoscopic procedure.  Patient ID and intended procedure confirmed with present staff. Received instructions for my participation in the procedure from the performing physician.  

## 2018-10-20 NOTE — Op Note (Signed)
[facility] Patient Name: Jody Ford Procedure Date: 10/20/2018 12:58 PM MRN: [id number] Endoscopist: Mauri Pole , MD Age: 52 Referring MD:  Date of Birth: 1966-01-08 Gender: Female Account #: [id number] Procedure:                Colonoscopy Indications:              Screening for colorectal malignant neoplasm, This                            is the patient's first colonoscopy Medicines:                Monitored Anesthesia Care Procedure:                Pre-Anesthesia Assessment:                           - Prior to the procedure, a History and Physical                            was performed, and patient medications and                            allergies were reviewed. The patient's tolerance of                            previous anesthesia was also reviewed. The risks                            and benefits of the procedure and the sedation                            options and risks were discussed with the patient.                            All questions were answered, and informed consent                            was obtained. Prior Anticoagulants: The patient has                            taken no previous anticoagulant or antiplatelet                            agents. ASA Grade Assessment: II - A patient with                            mild systemic disease. After reviewing the risks                            and benefits, the patient was deemed in                            satisfactory condition to undergo the procedure.  After obtaining informed consent, the colonoscope                            was passed under direct vision. Throughout the                            procedure, the patient's blood pressure, pulse, and                            oxygen saturations were monitored continuously. The                            Colonoscope was introduced through the anus and                            advanced to the  the cecum, identified by                            appendiceal orifice and ileocecal valve. The                            colonoscopy was performed without difficulty. The                            patient tolerated the procedure well. The quality                            of the bowel preparation was excellent. The                            ileocecal valve, appendiceal orifice, and rectum                            were photographed. Scope In: 1:01:55 PM Scope Out: 1:15:55 PM Scope Withdrawal Time: 0 hours 9 minutes 22 seconds  Total Procedure Duration: 0 hours 14 minutes 0 seconds  Findings:                 The perianal and digital rectal examinations were                            normal.                           A 7 mm polyp was found in the cecum. The polyp was                            sessile. The polyp was removed with a cold snare.                            Resection and retrieval were complete.                           Scattered small-mouthed diverticula were found in  the sigmoid colon, descending colon, transverse                            colon and ascending colon.                           Non-bleeding internal hemorrhoids were found during                            retroflexion. The hemorrhoids were small. Complications:            No immediate complications. Estimated Blood Loss:     Estimated blood loss was minimal. Impression:               - One 7 mm polyp in the cecum, removed with a cold                            snare. Resected and retrieved.                           - Diverticulosis in the sigmoid colon, in the                            descending colon, in the transverse colon and in                            the ascending colon.                           - Non-bleeding internal hemorrhoids. Recommendation:           - Patient has a contact number available for                            emergencies. The signs and  symptoms of potential                            delayed complications were discussed with the                            patient. Return to normal activities tomorrow.                            Written discharge instructions were provided to the                            patient.                           - Resume previous diet.                           - Continue present medications.                           - Await pathology results.                           -  Repeat colonoscopy in 5-10 years for surveillance                            based on pathology results. Mauri Pole, MD 10/20/2018 1:19:26 PM This report has been signed electronically.

## 2018-10-20 NOTE — Patient Instructions (Signed)
Discharge instructions given. Handouts on polyps,diverticulosis and hemorrhoids. Resume previous medications. YOU HAD AN ENDOSCOPIC PROCEDURE TODAY AT THE [facility]:   Refer to the procedure report that was given to you for any specific questions about what was found during the examination.  If the procedure report does not answer your questions, please call your gastroenterologist to clarify.  If you requested that your care partner not be given the details of your procedure findings, then the procedure report has been included in a sealed envelope for you to review at your convenience later.  YOU SHOULD EXPECT: Some feelings of bloating in the abdomen. Passage of more gas than usual.  Walking can help get rid of the air that was put into your GI tract during the procedure and reduce the bloating. If you had a lower endoscopy (such as a colonoscopy or flexible sigmoidoscopy) you may notice spotting of blood in your stool or on the toilet paper. If you underwent a bowel prep for your procedure, you may not have a normal bowel movement for a few days.  Please Note:  You might notice some irritation and congestion in your nose or some drainage.  This is from the oxygen used during your procedure.  There is no need for concern and it should clear up in a day or so.  SYMPTOMS TO REPORT IMMEDIATELY:   Following lower endoscopy (colonoscopy or flexible sigmoidoscopy):  Excessive amounts of blood in the stool  Significant tenderness or worsening of abdominal pains  Swelling of the abdomen that is new, acute  Fever of 100F or higher   For urgent or emergent issues, a gastroenterologist can be reached at any hour by calling [phone].   DIET:  We do recommend a small meal at first, but then you may proceed to your regular diet.  Drink plenty of fluids but you should avoid alcoholic beverages for 24 hours.  ACTIVITY:  You should plan to take it easy for the rest of today and you  should NOT DRIVE or use heavy machinery until tomorrow (because of the sedation medicines used during the test).    FOLLOW UP: Our staff will call the number listed on your records the next business day following your procedure to check on you and address any questions or concerns that you may have regarding the information given to you following your procedure. If we do not reach you, we will leave a message.  However, if you are feeling well and you are not experiencing any problems, there is no need to return our call.  We will assume that you have returned to your regular daily activities without incident.  If any biopsies were taken you will be contacted by phone or by letter within the next 1-3 weeks.  Please call us at [phone] if you have not heard about the biopsies in 3 weeks.    SIGNATURES/CONFIDENTIALITY: You and/or your care partner have signed paperwork which will be entered into your electronic medical record.  These signatures attest to the fact that that the information above on your After Visit Summary has been reviewed and is understood.  Full responsibility of the confidentiality of this discharge information lies with you and/or your care-partner. 

## 2018-10-20 NOTE — Progress Notes (Signed)
To recovery, report to RN

## 2018-10-21 ENCOUNTER — Telehealth: Payer: Self-pay | Admitting: *Deleted

## 2018-10-21 NOTE — Telephone Encounter (Signed)
  Follow up Call-  Call back number 10/20/2018  Post procedure Call Back phone  # [phone]  Permission to leave phone message Yes  Some recent data might be hidden     Patient questions:  Do you have a fever, pain , or abdominal swelling? No. Pain Score  0 *  Have you tolerated food without any problems? Yes.    Have you been able to return to your normal activities? Yes.    Do you have any questions about your discharge instructions: Diet   No. Medications  No. Follow up visit  No.  Do you have questions or concerns about your Care? No.  Actions: * If pain score is 4 or above: No action needed, pain <4.

## 2018-10-27 ENCOUNTER — Encounter: Payer: Self-pay | Admitting: Gastroenterology

## 2019-05-05 ENCOUNTER — Other Ambulatory Visit: Payer: Self-pay | Admitting: Obstetrics and Gynecology

## 2019-05-05 DIAGNOSIS — Z1231 Encounter for screening mammogram for malignant neoplasm of breast: Secondary | ICD-10-CM

## 2019-08-18 ENCOUNTER — Other Ambulatory Visit: Payer: Self-pay

## 2019-08-18 ENCOUNTER — Ambulatory Visit
Admission: RE | Admit: 2019-08-18 | Discharge: 2019-08-18 | Disposition: A | Discharge status: Home / Self Care | Payer: BC Managed Care – PPO | Source: Ambulatory Visit | Attending: Obstetrics and Gynecology | Admitting: Obstetrics and Gynecology

## 2019-08-18 DIAGNOSIS — Z1231 Encounter for screening mammogram for malignant neoplasm of breast: Secondary | ICD-10-CM

## 2019-09-22 ENCOUNTER — Ambulatory Visit (INDEPENDENT_AMBULATORY_CARE_PROVIDER_SITE_OTHER): Payer: BC Managed Care – PPO | Admitting: Internal Medicine

## 2019-09-22 ENCOUNTER — Other Ambulatory Visit: Payer: Self-pay

## 2019-09-22 ENCOUNTER — Encounter: Payer: Self-pay | Admitting: Internal Medicine

## 2019-09-22 ENCOUNTER — Other Ambulatory Visit (INDEPENDENT_AMBULATORY_CARE_PROVIDER_SITE_OTHER): Payer: BC Managed Care – PPO

## 2019-09-22 VITALS — BP 124/78 | HR 65 | Temp 98.6°F | Ht 64.0 in | Wt 166.0 lb

## 2019-09-22 DIAGNOSIS — Z23 Encounter for immunization: Secondary | ICD-10-CM

## 2019-09-22 DIAGNOSIS — D682 Hereditary deficiency of other clotting factors: Secondary | ICD-10-CM

## 2019-09-22 DIAGNOSIS — Z0000 Encounter for general adult medical examination without abnormal findings: Secondary | ICD-10-CM | POA: Diagnosis not present

## 2019-09-22 DIAGNOSIS — K635 Polyp of colon: Secondary | ICD-10-CM

## 2019-09-22 DIAGNOSIS — C4441 Basal cell carcinoma of skin of scalp and neck: Secondary | ICD-10-CM

## 2019-09-22 DIAGNOSIS — C4491 Basal cell carcinoma of skin, unspecified: Secondary | ICD-10-CM | POA: Insufficient documentation

## 2019-09-22 DIAGNOSIS — F329 Major depressive disorder, single episode, unspecified: Secondary | ICD-10-CM | POA: Diagnosis not present

## 2019-09-22 DIAGNOSIS — F32A Depression, unspecified: Secondary | ICD-10-CM

## 2019-09-22 LAB — URINALYSIS
Bilirubin Urine: NEGATIVE
Hgb urine dipstick: NEGATIVE
Ketones, ur: NEGATIVE
Leukocytes,Ua: NEGATIVE
Nitrite: NEGATIVE
Specific Gravity, Urine: >=1.03 — AB (ref 1.000–1.030)
Total Protein, Urine: NEGATIVE
Urine Glucose: NEGATIVE
Urobilinogen, UA: 0.2 (ref 0.0–1.0)
pH: 5 (ref 5.0–8.0)

## 2019-09-22 LAB — LIPID PANEL
Cholesterol: 157 mg/dL (ref 0–200)
HDL: 64.5 mg/dL (ref >39.00)
LDL Cholesterol: 82 mg/dL (ref 0–99)
NonHDL: 92.2
Total CHOL/HDL Ratio: 2
Triglycerides: 50 mg/dL (ref 0.0–149.0)
VLDL: 10 mg/dL (ref 0.0–40.0)

## 2019-09-22 LAB — HEPATIC FUNCTION PANEL
ALT: 15 U/L (ref 0–35)
AST: 17 U/L (ref 0–37)
Albumin: 4.3 g/dL (ref 3.5–5.2)
Alkaline Phosphatase: 87 U/L (ref 39–117)
Bilirubin, Direct: 0.1 mg/dL (ref 0.0–0.3)
Total Bilirubin: 0.5 mg/dL (ref 0.2–1.2)
Total Protein: 6.7 g/dL (ref 6.0–8.3)

## 2019-09-22 LAB — CBC WITH DIFFERENTIAL/PLATELET
Basophils Absolute: 0 10*3/uL (ref 0.0–0.1)
Basophils Relative: 0.4 % (ref 0.0–3.0)
Eosinophils Absolute: 0.2 10*3/uL (ref 0.0–0.7)
Eosinophils Relative: 4.1 % (ref 0.0–5.0)
HCT: 39.4 % (ref 36.0–46.0)
Hemoglobin: 13 g/dL (ref 12.0–15.0)
Lymphocytes Relative: 26.1 % (ref 12.0–46.0)
Lymphs Abs: 1.3 10*3/uL (ref 0.7–4.0)
MCHC: 33 g/dL (ref 30.0–36.0)
MCV: 94.4 fl (ref 78.0–100.0)
Monocytes Absolute: 0.4 10*3/uL (ref 0.1–1.0)
Monocytes Relative: 8.7 % (ref 3.0–12.0)
Neutro Abs: 3 10*3/uL (ref 1.4–7.7)
Neutrophils Relative %: 60.7 % (ref 43.0–77.0)
Platelets: 259 10*3/uL (ref 150.0–400.0)
RBC: 4.18 Mil/uL (ref 3.87–5.11)
RDW: 13.3 % (ref 11.5–15.5)
WBC: 5 10*3/uL (ref 4.0–10.5)

## 2019-09-22 LAB — BASIC METABOLIC PANEL
BUN: 17 mg/dL (ref 6–23)
CO2: 27 mEq/L (ref 19–32)
Calcium: 9.2 mg/dL (ref 8.4–10.5)
Chloride: 104 mEq/L (ref 96–112)
Creatinine, Ser: 0.74 mg/dL (ref 0.40–1.20)
GFR: 81.84 mL/min (ref >60.00)
Glucose, Bld: 108 mg/dL — ABNORMAL HIGH (ref 70–99)
Potassium: 4.2 mEq/L (ref 3.5–5.1)
Sodium: 138 mEq/L (ref 135–145)

## 2019-09-22 LAB — TSH: TSH: 1.04 u[IU]/mL (ref 0.35–4.50)

## 2019-09-22 NOTE — Assessment & Plan Note (Signed)
scalp - seeing [facility] yearly

## 2019-09-22 NOTE — Assessment & Plan Note (Signed)
tests and vaccinations. We discussed a need for adhering to healthy diet and exercise. Labs were ordered to be later reviewed . All questions were answered. PAP per GYN Colonoscopy - 2019, due 2024 (polyp) Shingrix discussed

## 2019-09-22 NOTE — Progress Notes (Signed)
Subjective:  Patient ID: Jody Ford, female    DOB: [birth year]  Age: 53 y.o. MRN: [id number]  CC: No chief complaint on file.   HPI Jody Ford presents for a well exam  Outpatient Medications Prior to Visit  Medication Sig Dispense Refill  . aspirin 81 MG tablet Take 81 mg by mouth daily.    . cholecalciferol (VITAMIN D) 1000 units tablet Take 2 tablets (2,000 Units total) by mouth daily. 100 tablet 3  . Multiple Vitamin (MULTIVITAMIN) tablet Take 1 tablet by mouth daily.    . pentoxifylline (TRENTAL) 400 MG CR tablet Take 1 tablet (400 mg total) by mouth 3 (three) times daily with meals. 90 tablet 11  . venlafaxine XR (EFFEXOR-XR) 37.5 MG 24 hr capsule Take by mouth daily.     No facility-administered medications prior to visit.     ROS: Review of Systems  Constitutional: Negative for activity change, appetite change, chills, fatigue and unexpected weight change.  HENT: Negative for congestion, mouth sores and sinus pressure.   Eyes: Negative for visual disturbance.  Respiratory: Negative for cough and chest tightness.   Gastrointestinal: Negative for abdominal pain and nausea.  Genitourinary: Negative for difficulty urinating, frequency and vaginal pain.  Musculoskeletal: Negative for back pain and gait problem.  Skin: Negative for pallor and rash.  Neurological: Negative for dizziness, tremors, weakness, numbness and headaches.  Psychiatric/Behavioral: Negative for confusion, sleep disturbance and suicidal ideas.    Objective:  BP 124/78 (BP Location: Left Arm, Patient Position: Sitting, Cuff Size: Normal)   Pulse 65   Temp 98.6 F (37 C) (Oral)   Ht 5\' 4"  (1.626 m)   Wt 166 lb (75.3 kg)   SpO2 97%   BMI 28.49 kg/m   BP Readings from Last 3 Encounters:  09/22/19 124/78  10/20/18 (!) 100/46  08/12/18 120/70    Wt Readings from Last 3 Encounters:  09/22/19 166 lb (75.3 kg)  10/20/18 174 lb (78.9 kg)  10/06/18 174 lb (78.9 kg)    Physical Exam  Constitutional:      General: She is not in acute distress.    Appearance: She is well-developed.  HENT:     Head: Normocephalic.     Right Ear: External ear normal.     Left Ear: External ear normal.     Nose: Nose normal.  Eyes:     General:        Right eye: No discharge.        Left eye: No discharge.     Conjunctiva/sclera: Conjunctivae normal.     Pupils: Pupils are equal, round, and reactive to light.  Neck:     Musculoskeletal: Normal range of motion and neck supple.     Thyroid: No thyromegaly.     Vascular: No JVD.     Trachea: No tracheal deviation.  Cardiovascular:     Rate and Rhythm: Normal rate and regular rhythm.     Heart sounds: Normal heart sounds.  Pulmonary:     Effort: No respiratory distress.     Breath sounds: No stridor. No wheezing.  Abdominal:     General: Bowel sounds are normal. There is no distension.     Palpations: Abdomen is soft. There is no mass.     Tenderness: There is no abdominal tenderness. There is no guarding or rebound.  Musculoskeletal:        General: No tenderness.  Lymphadenopathy:     Cervical: No cervical adenopathy.  Skin:  Findings: No erythema or rash.  Neurological:     Cranial Nerves: No cranial nerve deficit.     Motor: No abnormal muscle tone.     Coordination: Coordination normal.     Deep Tendon Reflexes: Reflexes normal.  Psychiatric:        Behavior: Behavior normal.        Thought Content: Thought content normal.        Judgment: Judgment normal.     Lab Results  Component Value Date   WBC 4.5 08/12/2018   HGB 12.8 08/12/2018   HCT 38.6 08/12/2018   PLT 257.0 08/12/2018   GLUCOSE 111 (H) 08/12/2018   CHOL 137 08/12/2018   TRIG 56.0 08/12/2018   HDL 63.60 08/12/2018   LDLCALC 62 08/12/2018   ALT 19 08/12/2018   AST 17 08/12/2018   NA 139 08/12/2018   K 3.9 08/12/2018   CL 105 08/12/2018   CREATININE 0.85 08/12/2018   BUN 19 08/12/2018   CO2 25 08/12/2018   TSH 2.16 08/12/2018   INR 1.1  (H) 09/17/2013   HGBA1C 5.8 06/17/2009    Mm 3d Screen Breast Bilateral  Result Date: 08/18/2019 CLINICAL DATA:  Screening. EXAM: DIGITAL SCREENING BILATERAL MAMMOGRAM WITH TOMO AND CAD COMPARISON:  Previous exam(s). ACR Breast Density Category c: The breast tissue is heterogeneously dense, which may obscure small masses. FINDINGS: There are no findings suspicious for malignancy. Images were processed with CAD. IMPRESSION: No mammographic evidence of malignancy. A result letter of this screening mammogram will be mailed directly to the patient. RECOMMENDATION: Screening mammogram in one year. (Code:SM-B-01Y) BI-RADS CATEGORY  1: Negative. Electronically Signed   By: Nolon Nations M.D.   On: 08/18/2019 13:31    Assessment & Plan:   There are no diagnoses linked to this encounter.   No orders of the defined types were placed in this encounter.    Follow-up: No follow-ups on file.  Walker Kehr, MD

## 2019-09-22 NOTE — Assessment & Plan Note (Signed)
Effexor

## 2019-09-22 NOTE — Assessment & Plan Note (Signed)
2019 Dr Silverio Decamp Colonoscopy - 2019, due 2024

## 2019-09-22 NOTE — Assessment & Plan Note (Signed)
ASA

## 2019-09-22 NOTE — Patient Instructions (Signed)
Cardiac CT calcium scoring test $150   Computed tomography, more commonly known as a CT or CAT scan, is a diagnostic medical imaging test. Like traditional x-rays, it produces multiple images or pictures of the inside of the body. The cross-sectional images generated during a CT scan can be reformatted in multiple planes. They can even generate three-dimensional images. These images can be viewed on a computer monitor, printed on film or by a 3D printer, or transferred to a CD or DVD. CT images of internal organs, bones, soft tissue and blood vessels provide greater detail than traditional x-rays, particularly of soft tissues and blood vessels. A cardiac CT scan for coronary calcium is a non-invasive way of obtaining information about the presence, location and extent of calcified plaque in the coronary arteries-the vessels that supply oxygen-containing blood to the heart muscle. Calcified plaque results when there is a build-up of fat and other substances under the inner layer of the artery. This material can calcify which signals the presence of atherosclerosis, a disease of the vessel wall, also called coronary artery disease (CAD). People with this disease have an increased risk for heart attacks. In addition, over time, progression of plaque build up (CAD) can narrow the arteries or even close off blood flow to the heart. The result may be chest pain, sometimes called "angina," or a heart attack. Because calcium is a marker of CAD, the amount of calcium detected on a cardiac CT scan is a helpful prognostic tool. The findings on cardiac CT are expressed as a calcium score. Another name for this test is coronary artery calcium scoring.  What are some common uses of the procedure? The goal of cardiac CT scan for calcium scoring is to determine if CAD is present and to what extent, even if there are no symptoms. It is a screening study that may be recommended by a physician for patients with risk factors  for CAD but no clinical symptoms. The major risk factors for CAD are: . high blood cholesterol levels  . family history of heart attacks  . diabetes  . high blood pressure  . cigarette smoking  . overweight or obese  . physical inactivity   A negative cardiac CT scan for calcium scoring shows no calcification within the coronary arteries. This suggests that CAD is absent or so minimal it cannot be seen by this technique. The chance of having a heart attack over the next two to five years is very low under these circumstances. A positive test means that CAD is present, regardless of whether or not the patient is experiencing any symptoms. The amount of calcification-expressed as the calcium score-may help to predict the likelihood of a myocardial infarction (heart attack) in the coming years and helps your medical doctor or cardiologist decide whether the patient may need to take preventive medicine or undertake other measures such as diet and exercise to lower the risk for heart attack. The extent of CAD is graded according to your calcium score:  Calcium Score  Presence of CAD (coronary artery disease)  0 No evidence of CAD   1-10 Minimal evidence of CAD  11-100 Mild evidence of CAD  101-400 Moderate evidence of CAD  Over 400 Extensive evidence of CAD    

## 2020-07-26 ENCOUNTER — Other Ambulatory Visit: Payer: Self-pay | Admitting: Obstetrics and Gynecology

## 2020-07-26 DIAGNOSIS — Z1231 Encounter for screening mammogram for malignant neoplasm of breast: Secondary | ICD-10-CM

## 2020-08-18 ENCOUNTER — Other Ambulatory Visit: Payer: Self-pay

## 2020-08-18 ENCOUNTER — Ambulatory Visit
Admission: RE | Admit: 2020-08-18 | Discharge: 2020-08-18 | Disposition: A | Discharge status: Home / Self Care | Payer: BC Managed Care – PPO | Source: Ambulatory Visit

## 2020-08-18 DIAGNOSIS — Z1231 Encounter for screening mammogram for malignant neoplasm of breast: Secondary | ICD-10-CM

## 2020-10-18 ENCOUNTER — Encounter: Payer: Self-pay | Admitting: Internal Medicine

## 2020-10-18 ENCOUNTER — Other Ambulatory Visit: Payer: Self-pay

## 2020-10-18 ENCOUNTER — Ambulatory Visit (INDEPENDENT_AMBULATORY_CARE_PROVIDER_SITE_OTHER): Payer: BC Managed Care – PPO | Admitting: Internal Medicine

## 2020-10-18 VITALS — BP 112/62 | HR 64 | Temp 98.6°F | Wt 177.6 lb

## 2020-10-18 DIAGNOSIS — Z0000 Encounter for general adult medical examination without abnormal findings: Secondary | ICD-10-CM

## 2020-10-18 DIAGNOSIS — R739 Hyperglycemia, unspecified: Secondary | ICD-10-CM

## 2020-10-18 LAB — CBC WITH DIFFERENTIAL/PLATELET
Basophils Absolute: 0 10*3/uL (ref 0.0–0.1)
Basophils Relative: 0.5 % (ref 0.0–3.0)
Eosinophils Absolute: 0.2 10*3/uL (ref 0.0–0.7)
Eosinophils Relative: 3.9 % (ref 0.0–5.0)
HCT: 39.4 % (ref 36.0–46.0)
Hemoglobin: 13.4 g/dL (ref 12.0–15.0)
Lymphocytes Relative: 29.7 % (ref 12.0–46.0)
Lymphs Abs: 1.4 10*3/uL (ref 0.7–4.0)
MCHC: 34 g/dL (ref 30.0–36.0)
MCV: 93.1 fl (ref 78.0–100.0)
Monocytes Absolute: 0.4 10*3/uL (ref 0.1–1.0)
Monocytes Relative: 8.5 % (ref 3.0–12.0)
Neutro Abs: 2.8 10*3/uL (ref 1.4–7.7)
Neutrophils Relative %: 57.4 % (ref 43.0–77.0)
Platelets: 258 10*3/uL (ref 150.0–400.0)
RBC: 4.24 Mil/uL (ref 3.87–5.11)
RDW: 13.2 % (ref 11.5–15.5)
WBC: 4.8 10*3/uL (ref 4.0–10.5)

## 2020-10-18 LAB — LIPID PANEL
Cholesterol: 158 mg/dL (ref 0–200)
HDL: 69.3 mg/dL (ref >39.00)
LDL Cholesterol: 79 mg/dL (ref 0–99)
NonHDL: 88.96
Total CHOL/HDL Ratio: 2
Triglycerides: 50 mg/dL (ref 0.0–149.0)
VLDL: 10 mg/dL (ref 0.0–40.0)

## 2020-10-18 LAB — COMPREHENSIVE METABOLIC PANEL
ALT: 20 U/L (ref 0–35)
AST: 19 U/L (ref 0–37)
Albumin: 4.3 g/dL (ref 3.5–5.2)
Alkaline Phosphatase: 81 U/L (ref 39–117)
BUN: 17 mg/dL (ref 6–23)
CO2: 28 mEq/L (ref 19–32)
Calcium: 9.2 mg/dL (ref 8.4–10.5)
Chloride: 104 mEq/L (ref 96–112)
Creatinine, Ser: 0.77 mg/dL (ref 0.40–1.20)
GFR: 87.17 mL/min (ref >60.00)
Glucose, Bld: 106 mg/dL — ABNORMAL HIGH (ref 70–99)
Potassium: 4.4 mEq/L (ref 3.5–5.1)
Sodium: 138 mEq/L (ref 135–145)
Total Bilirubin: 0.4 mg/dL (ref 0.2–1.2)
Total Protein: 6.7 g/dL (ref 6.0–8.3)

## 2020-10-18 LAB — URINALYSIS
Bilirubin Urine: NEGATIVE
Hgb urine dipstick: NEGATIVE
Ketones, ur: NEGATIVE
Leukocytes,Ua: NEGATIVE
Nitrite: NEGATIVE
Specific Gravity, Urine: >=1.03 — AB (ref 1.000–1.030)
Total Protein, Urine: NEGATIVE
Urine Glucose: NEGATIVE
Urobilinogen, UA: 0.2 (ref 0.0–1.0)
pH: 5.5 (ref 5.0–8.0)

## 2020-10-18 LAB — TSH: TSH: 1.26 u[IU]/mL (ref 0.35–4.50)

## 2020-10-18 LAB — HEMOGLOBIN A1C: Hgb A1c MFr Bld: 6.4 % (ref 4.6–6.5)

## 2020-10-18 NOTE — Progress Notes (Signed)
Subjective:  Patient ID: Jody Ford, female    DOB: 1965/12/16  Age: 54 y.o. MRN: [id number]  CC: Annual Exam (Requesting Biometric forms to be fill-out)   HPI Jody Ford presents for a well exam   Outpatient Medications Prior to Visit  Medication Sig Dispense Refill  . aspirin 81 MG tablet Take 81 mg by mouth daily.    . cholecalciferol (VITAMIN D) 1000 units tablet Take 2 tablets (2,000 Units total) by mouth daily. 100 tablet 3  . Multiple Vitamin (MULTIVITAMIN) tablet Take 1 tablet by mouth daily.    Marland Kitchen venlafaxine XR (EFFEXOR-XR) 37.5 MG 24 hr capsule Take by mouth daily.    . pentoxifylline (TRENTAL) 400 MG CR tablet Take 1 tablet (400 mg total) by mouth 3 (three) times daily with meals. 90 tablet 11   No facility-administered medications prior to visit.    ROS: Review of Systems  Constitutional: Negative for activity change, appetite change, chills, fatigue and unexpected weight change.  HENT: Negative for congestion, mouth sores and sinus pressure.   Eyes: Negative for visual disturbance.  Respiratory: Negative for cough and chest tightness.   Gastrointestinal: Negative for abdominal pain and nausea.  Genitourinary: Negative for difficulty urinating, frequency and vaginal pain.  Musculoskeletal: Negative for back pain and gait problem.  Skin: Negative for pallor and rash.  Neurological: Negative for dizziness, tremors, weakness, numbness and headaches.  Psychiatric/Behavioral: Negative for confusion and sleep disturbance.    Objective:  BP 112/62 (BP Location: Left Arm)   Pulse 64   Temp 98.6 F (37 C) (Oral)   Wt 177 lb 9.6 oz (80.6 kg)   SpO2 97%   BMI 30.48 kg/m   BP Readings from Last 3 Encounters:  10/18/20 112/62  09/22/19 124/78  10/20/18 (!) 100/46    Wt Readings from Last 3 Encounters:  10/18/20 177 lb 9.6 oz (80.6 kg)  09/22/19 166 lb (75.3 kg)  10/20/18 174 lb (78.9 kg)    Physical Exam Constitutional:      General: She is  not in acute distress.    Appearance: She is well-developed. She is obese.  HENT:     Head: Normocephalic.     Right Ear: External ear normal.     Left Ear: External ear normal.     Nose: Nose normal.  Eyes:     General:        Right eye: No discharge.        Left eye: No discharge.     Conjunctiva/sclera: Conjunctivae normal.     Pupils: Pupils are equal, round, and reactive to light.  Neck:     Thyroid: No thyromegaly.     Vascular: No JVD.     Trachea: No tracheal deviation.  Cardiovascular:     Rate and Rhythm: Normal rate and regular rhythm.     Heart sounds: Normal heart sounds.  Pulmonary:     Effort: No respiratory distress.     Breath sounds: No stridor. No wheezing.  Abdominal:     General: Bowel sounds are normal. There is no distension.     Palpations: Abdomen is soft. There is no mass.     Tenderness: There is no abdominal tenderness. There is no guarding or rebound.  Musculoskeletal:        General: No tenderness.     Cervical back: Normal range of motion and neck supple.  Lymphadenopathy:     Cervical: No cervical adenopathy.  Skin:    Findings: No  erythema or rash.  Neurological:     Cranial Nerves: No cranial nerve deficit.     Motor: No abnormal muscle tone.     Coordination: Coordination normal.     Deep Tendon Reflexes: Reflexes normal.  Psychiatric:        Behavior: Behavior normal.        Thought Content: Thought content normal.        Judgment: Judgment normal.     Lab Results  Component Value Date   WBC 5.0 09/22/2019   HGB 13.0 09/22/2019   HCT 39.4 09/22/2019   PLT 259.0 09/22/2019   GLUCOSE 108 (H) 09/22/2019   CHOL 157 09/22/2019   TRIG 50.0 09/22/2019   HDL 64.50 09/22/2019   LDLCALC 82 09/22/2019   ALT 15 09/22/2019   AST 17 09/22/2019   NA 138 09/22/2019   K 4.2 09/22/2019   CL 104 09/22/2019   CREATININE 0.74 09/22/2019   BUN 17 09/22/2019   CO2 27 09/22/2019   TSH 1.04 09/22/2019   INR 1.1 (H) 09/17/2013   HGBA1C 5.8  06/17/2009    MM 3D SCREEN BREAST BILATERAL  Result Date: 08/19/2020 CLINICAL DATA:  Screening. EXAM: DIGITAL SCREENING BILATERAL MAMMOGRAM WITH TOMO AND CAD COMPARISON:  Previous exam(s). ACR Breast Density Category c: The breast tissue is heterogeneously dense, which may obscure small masses. FINDINGS: There are no findings suspicious for malignancy. Images were processed with CAD. IMPRESSION: No mammographic evidence of malignancy. A result letter of this screening mammogram will be mailed directly to the patient. RECOMMENDATION: Screening mammogram in one year. (Code:SM-B-01Y) BI-RADS CATEGORY  1: Negative. Electronically Signed   By: Lillia Mountain M.D.   On: 08/19/2020 13:37    Assessment & Plan:   Walker Kehr, MD

## 2020-10-18 NOTE — Assessment & Plan Note (Signed)
We discussed age appropriate health related issues, including available/recomended screening tests and vaccinations. We discussed a need for adhering to healthy diet and exercise. Labs were ordered to be later reviewed . All questions were answered. PAP per GYN Colonoscopy - 2019, due 2024 (polyp) Shingrix discussed again

## 2020-10-26 ENCOUNTER — Telehealth: Payer: Self-pay | Admitting: Internal Medicine

## 2020-10-26 NOTE — Telephone Encounter (Signed)
I do not have form, If she dropped off during LOV, Does Dr.Plot have this?

## 2020-10-26 NOTE — Telephone Encounter (Signed)
  Patient calling for status of form. Patient states she dropped off form for biometric screening during last office visit  Please advise

## 2020-10-26 NOTE — Telephone Encounter (Signed)
I don't have or seen any forms.Marland KitchenJohny Chess

## 2020-10-28 NOTE — Telephone Encounter (Signed)
Patient following up again about the insurance form, she said she gave it to the nurse at her visit on 11.9.21  Please follow-up with the patient

## 2020-10-28 NOTE — Telephone Encounter (Signed)
Biometric form was give to Dr. Alain Marion. Pls advise on forms.Marland KitchenJohny Chess

## 2020-11-01 NOTE — Telephone Encounter (Signed)
Called pt on cell # no answer.. could not leave msg it asking foe a mail box #. (Not sure if that's her correct #). Called home # there was no answer LMOM w/MD response on forms.Marland KitchenJohny Chess

## 2020-11-01 NOTE — Telephone Encounter (Signed)
Sorry, I may have lost it.  Can she get another one to [place]?  Thanks

## 2020-11-07 NOTE — Telephone Encounter (Signed)
Pt faxed another Biometric form MD completed and signed. Faxed back to pt and to [id number] @ [phone]...Johny Chess

## 2021-06-22 ENCOUNTER — Other Ambulatory Visit: Payer: Self-pay | Admitting: Internal Medicine

## 2021-06-22 DIAGNOSIS — Z1231 Encounter for screening mammogram for malignant neoplasm of breast: Secondary | ICD-10-CM

## 2021-08-21 ENCOUNTER — Ambulatory Visit
Admission: RE | Admit: 2021-08-21 | Discharge: 2021-08-21 | Disposition: A | Discharge status: Home / Self Care | Payer: BC Managed Care – PPO | Source: Ambulatory Visit | Attending: Internal Medicine | Admitting: Internal Medicine

## 2021-08-21 ENCOUNTER — Other Ambulatory Visit: Payer: Self-pay

## 2021-08-21 DIAGNOSIS — Z1231 Encounter for screening mammogram for malignant neoplasm of breast: Secondary | ICD-10-CM

## 2021-09-26 ENCOUNTER — Encounter: Payer: BC Managed Care – PPO | Admitting: Internal Medicine

## 2021-10-24 ENCOUNTER — Other Ambulatory Visit: Payer: Self-pay

## 2021-10-24 ENCOUNTER — Encounter: Payer: Self-pay | Admitting: Internal Medicine

## 2021-10-24 ENCOUNTER — Ambulatory Visit (INDEPENDENT_AMBULATORY_CARE_PROVIDER_SITE_OTHER): Payer: BC Managed Care – PPO | Admitting: Internal Medicine

## 2021-10-24 VITALS — BP 112/72 | HR 62 | Temp 98.1°F | Ht 64.0 in | Wt 171.8 lb

## 2021-10-24 DIAGNOSIS — Z0000 Encounter for general adult medical examination without abnormal findings: Secondary | ICD-10-CM

## 2021-10-24 DIAGNOSIS — D682 Hereditary deficiency of other clotting factors: Secondary | ICD-10-CM | POA: Diagnosis not present

## 2021-10-24 DIAGNOSIS — K635 Polyp of colon: Secondary | ICD-10-CM | POA: Diagnosis not present

## 2021-10-24 DIAGNOSIS — R739 Hyperglycemia, unspecified: Secondary | ICD-10-CM

## 2021-10-24 LAB — COMPREHENSIVE METABOLIC PANEL
ALT: 17 U/L (ref 0–35)
AST: 19 U/L (ref 0–37)
Albumin: 4.4 g/dL (ref 3.5–5.2)
Alkaline Phosphatase: 86 U/L (ref 39–117)
BUN: 16 mg/dL (ref 6–23)
CO2: 27 mEq/L (ref 19–32)
Calcium: 9.4 mg/dL (ref 8.4–10.5)
Chloride: 106 mEq/L (ref 96–112)
Creatinine, Ser: 0.86 mg/dL (ref 0.40–1.20)
GFR: 75.8 mL/min (ref >60.00)
Glucose, Bld: 106 mg/dL — ABNORMAL HIGH (ref 70–99)
Potassium: 4.2 mEq/L (ref 3.5–5.1)
Sodium: 139 mEq/L (ref 135–145)
Total Bilirubin: 0.5 mg/dL (ref 0.2–1.2)
Total Protein: 6.9 g/dL (ref 6.0–8.3)

## 2021-10-24 LAB — URINALYSIS
Bilirubin Urine: NEGATIVE
Hgb urine dipstick: NEGATIVE
Ketones, ur: NEGATIVE
Leukocytes,Ua: NEGATIVE
Nitrite: NEGATIVE
Specific Gravity, Urine: >=1.03 — AB (ref 1.000–1.030)
Total Protein, Urine: NEGATIVE
Urine Glucose: NEGATIVE
Urobilinogen, UA: 0.2 (ref 0.0–1.0)
pH: 5.5 (ref 5.0–8.0)

## 2021-10-24 LAB — TSH: TSH: 1.21 u[IU]/mL (ref 0.35–5.50)

## 2021-10-24 LAB — CBC WITH DIFFERENTIAL/PLATELET
Basophils Absolute: 0 10*3/uL (ref 0.0–0.1)
Basophils Relative: 0.6 % (ref 0.0–3.0)
Eosinophils Absolute: 0.1 10*3/uL (ref 0.0–0.7)
Eosinophils Relative: 3.6 % (ref 0.0–5.0)
HCT: 39.9 % (ref 36.0–46.0)
Hemoglobin: 13.1 g/dL (ref 12.0–15.0)
Lymphocytes Relative: 30.6 % (ref 12.0–46.0)
Lymphs Abs: 1.3 10*3/uL (ref 0.7–4.0)
MCHC: 32.8 g/dL (ref 30.0–36.0)
MCV: 93 fl (ref 78.0–100.0)
Monocytes Absolute: 0.4 10*3/uL (ref 0.1–1.0)
Monocytes Relative: 9.2 % (ref 3.0–12.0)
Neutro Abs: 2.3 10*3/uL (ref 1.4–7.7)
Neutrophils Relative %: 56 % (ref 43.0–77.0)
Platelets: 267 10*3/uL (ref 150.0–400.0)
RBC: 4.29 Mil/uL (ref 3.87–5.11)
RDW: 13.1 % (ref 11.5–15.5)
WBC: 4.1 10*3/uL (ref 4.0–10.5)

## 2021-10-24 LAB — LIPID PANEL
Cholesterol: 159 mg/dL (ref 0–200)
HDL: 66.6 mg/dL (ref >39.00)
LDL Cholesterol: 82 mg/dL (ref 0–99)
NonHDL: 92.55
Total CHOL/HDL Ratio: 2
Triglycerides: 51 mg/dL (ref 0.0–149.0)
VLDL: 10.2 mg/dL (ref 0.0–40.0)

## 2021-10-24 LAB — HEMOGLOBIN A1C: Hgb A1c MFr Bld: 6.6 % — ABNORMAL HIGH (ref 4.6–6.5)

## 2021-10-24 NOTE — Assessment & Plan Note (Signed)
We discussed age appropriate health related issues, including available/recomended screening tests and vaccinations. We discussed a need for adhering to healthy diet and exercise. Labs were ordered to be later reviewed . All questions were answered. PAP per GYN Colonoscopy - 2019, due 2024 (polyp) Shingrix discussed No fam h/o CAD 

## 2021-10-24 NOTE — Addendum Note (Signed)
Addended by: Jacobo Forest on: 10/24/2021 08:23 AM   Modules accepted: Orders

## 2021-10-24 NOTE — Assessment & Plan Note (Signed)
Colonoscopy - 2019, due 2024

## 2021-10-24 NOTE — Progress Notes (Signed)
Subjective:  Patient ID: Jody Ford, female    DOB: 1966/11/07  Age: 55 y.o. MRN: [id number]  CC: Annual Exam   HPI EDWENA Ford presents for a well exam  Outpatient Medications Prior to Visit  Medication Sig Dispense Refill   aspirin 81 MG tablet Take 81 mg by mouth daily.     Multiple Vitamin (MULTIVITAMIN) tablet Take 1 tablet by mouth daily.     pentoxifylline (TRENTAL) 400 MG CR tablet Take 400 mg by mouth 3 (three) times daily. Take 1 by mouth 3 (three) times daily.     venlafaxine XR (EFFEXOR-XR) 37.5 MG 24 hr capsule Take by mouth daily.     cholecalciferol (VITAMIN D) 1000 units tablet Take 2 tablets (2,000 Units total) by mouth daily. 100 tablet 3   No facility-administered medications prior to visit.    ROS: Review of Systems  Constitutional:  Negative for activity change, appetite change, chills, fatigue and unexpected weight change.  HENT:  Negative for congestion, mouth sores and sinus pressure.   Eyes:  Negative for visual disturbance.  Respiratory:  Negative for cough and chest tightness.   Gastrointestinal:  Negative for abdominal pain and nausea.  Genitourinary:  Negative for difficulty urinating, frequency and vaginal pain.  Musculoskeletal:  Negative for back pain and gait problem.  Skin:  Negative for pallor and rash.  Neurological:  Negative for dizziness, tremors, weakness, numbness and headaches.  Psychiatric/Behavioral:  Negative for confusion and sleep disturbance.    Objective:  BP 112/72 (BP Location: Left Arm)   Pulse 62   Temp 98.1 F (36.7 C) (Oral)   Ht 5\' 4"  (1.626 m)   Wt 171 lb 12.8 oz (77.9 kg)   SpO2 97%   BMI 29.49 kg/m   BP Readings from Last 3 Encounters:  10/24/21 112/72  10/18/20 112/62  09/22/19 124/78    Wt Readings from Last 3 Encounters:  10/24/21 171 lb 12.8 oz (77.9 kg)  10/18/20 177 lb 9.6 oz (80.6 kg)  09/22/19 166 lb (75.3 kg)    Physical Exam Constitutional:      General: She is not in acute  distress.    Appearance: She is well-developed.  HENT:     Head: Normocephalic.     Right Ear: External ear normal.     Left Ear: External ear normal.     Nose: Nose normal.  Eyes:     General:        Right eye: No discharge.        Left eye: No discharge.     Conjunctiva/sclera: Conjunctivae normal.     Pupils: Pupils are equal, round, and reactive to light.  Neck:     Thyroid: No thyromegaly.     Vascular: No JVD.     Trachea: No tracheal deviation.  Cardiovascular:     Rate and Rhythm: Normal rate and regular rhythm.     Heart sounds: Normal heart sounds.  Pulmonary:     Effort: No respiratory distress.     Breath sounds: No stridor. No wheezing.  Abdominal:     General: Bowel sounds are normal. There is no distension.     Palpations: Abdomen is soft. There is no mass.     Tenderness: There is no abdominal tenderness. There is no guarding or rebound.  Musculoskeletal:        General: No tenderness.     Cervical back: Normal range of motion and neck supple. No rigidity.  Lymphadenopathy:  Cervical: No cervical adenopathy.  Skin:    Findings: No erythema or rash.  Neurological:     Cranial Nerves: No cranial nerve deficit.     Motor: No abnormal muscle tone.     Coordination: Coordination normal.     Deep Tendon Reflexes: Reflexes normal.  Psychiatric:        Behavior: Behavior normal.        Thought Content: Thought content normal.        Judgment: Judgment normal.    Lab Results  Component Value Date   WBC 4.8 10/18/2020   HGB 13.4 10/18/2020   HCT 39.4 10/18/2020   PLT 258.0 10/18/2020   GLUCOSE 106 (H) 10/18/2020   CHOL 158 10/18/2020   TRIG 50.0 10/18/2020   HDL 69.30 10/18/2020   LDLCALC 79 10/18/2020   ALT 20 10/18/2020   AST 19 10/18/2020   NA 138 10/18/2020   K 4.4 10/18/2020   CL 104 10/18/2020   CREATININE 0.77 10/18/2020   BUN 17 10/18/2020   CO2 28 10/18/2020   TSH 1.26 10/18/2020   INR 1.1 (H) 09/17/2013   HGBA1C 6.4 10/18/2020     MM 3D SCREEN BREAST BILATERAL  Result Date: 08/23/2021 CLINICAL DATA:  Screening. EXAM: DIGITAL SCREENING BILATERAL MAMMOGRAM WITH TOMOSYNTHESIS AND CAD TECHNIQUE: Bilateral screening digital craniocaudal and mediolateral oblique mammograms were obtained. Bilateral screening digital breast tomosynthesis was performed. The images were evaluated with computer-aided detection. COMPARISON:  Previous exam(s). ACR Breast Density Category c: The breast tissue is heterogeneously dense, which may obscure small masses. FINDINGS: There are no findings suspicious for malignancy. IMPRESSION: No mammographic evidence of malignancy. A result letter of this screening mammogram will be mailed directly to the patient. RECOMMENDATION: Screening mammogram in one year. (Code:SM-B-01Y) BI-RADS CATEGORY  1: Negative. Electronically Signed   By: Ammie Ferrier M.D.   On: 08/23/2021 11:41    Assessment & Plan:   Problem List Items Addressed This Visit     Colon polyp    Colonoscopy - 2019, due 2024      Factor V deficiency ([place])   Well adult exam - Primary    We discussed age appropriate health related issues, including available/recomended screening tests and vaccinations. We discussed a need for adhering to healthy diet and exercise. Labs were ordered to be later reviewed . All questions were answered. PAP per GYN Colonoscopy - 2019, due 2024 (polyp) Shingrix discussed No fam h/o CAD      Relevant Orders   TSH   Urinalysis   CBC with Differential/Platelet   Lipid panel   Comprehensive metabolic panel   Hemoglobin A1c   Other Visit Diagnoses     Hyperglycemia       Relevant Orders   Hemoglobin A1c         No orders of the defined types were placed in this encounter.     Follow-up: Return in about 1 year (around 10/24/2022) for Wellness Exam.  Jody Kehr, MD

## 2021-10-27 ENCOUNTER — Other Ambulatory Visit: Payer: Self-pay | Admitting: Internal Medicine

## 2021-10-27 DIAGNOSIS — R739 Hyperglycemia, unspecified: Secondary | ICD-10-CM

## 2022-01-19 ENCOUNTER — Other Ambulatory Visit: Payer: Self-pay

## 2022-01-19 ENCOUNTER — Other Ambulatory Visit (INDEPENDENT_AMBULATORY_CARE_PROVIDER_SITE_OTHER): Payer: BC Managed Care – PPO

## 2022-01-19 DIAGNOSIS — Z0000 Encounter for general adult medical examination without abnormal findings: Secondary | ICD-10-CM

## 2022-01-19 DIAGNOSIS — R739 Hyperglycemia, unspecified: Secondary | ICD-10-CM

## 2022-01-19 DIAGNOSIS — E559 Vitamin D deficiency, unspecified: Secondary | ICD-10-CM

## 2022-01-19 LAB — COMPREHENSIVE METABOLIC PANEL
ALT: 12 U/L (ref 0–35)
AST: 14 U/L (ref 0–37)
Albumin: 3.8 g/dL (ref 3.5–5.2)
Alkaline Phosphatase: 72 U/L (ref 39–117)
BUN: 20 mg/dL (ref 6–23)
CO2: 29 mEq/L (ref 19–32)
Calcium: 9.1 mg/dL (ref 8.4–10.5)
Chloride: 105 mEq/L (ref 96–112)
Creatinine, Ser: 0.88 mg/dL (ref 0.40–1.20)
GFR: 73.61 mL/min (ref >60.00)
Glucose, Bld: 102 mg/dL — ABNORMAL HIGH (ref 70–99)
Potassium: 3.8 mEq/L (ref 3.5–5.1)
Sodium: 140 mEq/L (ref 135–145)
Total Bilirubin: 0.4 mg/dL (ref 0.2–1.2)
Total Protein: 6.6 g/dL (ref 6.0–8.3)

## 2022-01-19 LAB — HEMOGLOBIN A1C: Hgb A1c MFr Bld: 6.1 % (ref 4.6–6.5)

## 2022-01-19 LAB — VITAMIN D 25 HYDROXY (VIT D DEFICIENCY, FRACTURES): VITD: 27.86 ng/mL — ABNORMAL LOW (ref 30.00–100.00)

## 2022-01-24 ENCOUNTER — Ambulatory Visit: Payer: BC Managed Care – PPO | Admitting: Internal Medicine

## 2022-01-24 ENCOUNTER — Encounter: Payer: Self-pay | Admitting: Internal Medicine

## 2022-01-24 ENCOUNTER — Other Ambulatory Visit: Payer: Self-pay

## 2022-01-24 DIAGNOSIS — R739 Hyperglycemia, unspecified: Secondary | ICD-10-CM

## 2022-01-24 DIAGNOSIS — F32A Depression, unspecified: Secondary | ICD-10-CM | POA: Diagnosis not present

## 2022-01-24 DIAGNOSIS — E559 Vitamin D deficiency, unspecified: Secondary | ICD-10-CM | POA: Insufficient documentation

## 2022-01-24 MED ORDER — VITAMIN D (ERGOCALCIFEROL) 1.25 MG (50000 UNIT) PO CAPS: 50000.0000 [IU] | ORAL_CAPSULE | ORAL | 0 refills | Status: DC

## 2022-01-24 NOTE — Patient Instructions (Signed)
For a mild COVID-19 case - take zinc 50 mg a day for 1 week, vitamin C 1000 mg daily for 1 week, vitamin D2 50,000 units weekly for 2 months (unless  taking vitamin D daily already), an antioxidant Quercetin 500 mg twice a day for 1 week (if you can get it quick enough). Take Allegra or Benadryl.  Maintain good oral hydration and take Tylenol for high fever.  Call if problems. Isolate for 5 days, then wear a mask for 5 days per CDC.  

## 2022-01-24 NOTE — Assessment & Plan Note (Signed)
Low Vitamin D levels - please, start Vit D prescription 50000 iu weekly (Rx emailed to your pharmacy) followed by over-the-counter Vit D 2000 iu daily.

## 2022-01-24 NOTE — Assessment & Plan Note (Signed)
Cont on Effexor 

## 2022-01-24 NOTE — Assessment & Plan Note (Signed)
Doing well Low carb diet, fasting

## 2022-01-24 NOTE — Progress Notes (Signed)
Subjective:  Patient ID: Jody Ford, female    DOB: 1966-06-26  Age: 56 y.o. MRN: [id number]  CC: No chief complaint on file.   HPI SAMREEN SELTZER presents for pre-DM, wt gain, depression  Outpatient Medications Prior to Visit  Medication Sig Dispense Refill   aspirin 81 MG tablet Take 81 mg by mouth daily.     Multiple Vitamin (MULTIVITAMIN) tablet Take 1 tablet by mouth daily.     pentoxifylline (TRENTAL) 400 MG CR tablet Take 400 mg by mouth 3 (three) times daily. Take 1 by mouth 3 (three) times daily.     venlafaxine XR (EFFEXOR-XR) 37.5 MG 24 hr capsule Take by mouth daily.     No facility-administered medications prior to visit.    ROS: Review of Systems  Constitutional:  Negative for activity change, appetite change, chills, fatigue and unexpected weight change.  HENT:  Negative for congestion, mouth sores and sinus pressure.   Eyes:  Negative for visual disturbance.  Respiratory:  Negative for cough and chest tightness.   Gastrointestinal:  Negative for abdominal pain and nausea.  Genitourinary:  Negative for difficulty urinating, frequency and vaginal pain.  Musculoskeletal:  Negative for back pain and gait problem.  Skin:  Negative for pallor and rash.  Neurological:  Negative for dizziness, tremors, weakness, numbness and headaches.  Psychiatric/Behavioral:  Negative for confusion and sleep disturbance.    Objective:  BP 112/70 (BP Location: Left Arm, Patient Position: Sitting, Cuff Size: Large)    Pulse 66    Temp 97.6 F (36.4 C) (Oral)    Ht 5\' 4"  (1.626 m)    Wt 153 lb (69.4 kg)    SpO2 98%    BMI 26.26 kg/m   BP Readings from Last 3 Encounters:  01/24/22 112/70  10/24/21 112/72  10/18/20 112/62    Wt Readings from Last 3 Encounters:  01/24/22 153 lb (69.4 kg)  10/24/21 171 lb 12.8 oz (77.9 kg)  10/18/20 177 lb 9.6 oz (80.6 kg)    Physical Exam Constitutional:      General: She is not in acute distress.    Appearance: She is  well-developed.  HENT:     Head: Normocephalic.     Right Ear: External ear normal.     Left Ear: External ear normal.     Nose: Nose normal.  Eyes:     General:        Right eye: No discharge.        Left eye: No discharge.     Conjunctiva/sclera: Conjunctivae normal.     Pupils: Pupils are equal, round, and reactive to light.  Neck:     Thyroid: No thyromegaly.     Vascular: No JVD.     Trachea: No tracheal deviation.  Cardiovascular:     Rate and Rhythm: Normal rate and regular rhythm.     Heart sounds: Normal heart sounds.  Pulmonary:     Effort: No respiratory distress.     Breath sounds: No stridor. No wheezing.  Abdominal:     General: Bowel sounds are normal. There is no distension.     Palpations: Abdomen is soft. There is no mass.     Tenderness: There is no abdominal tenderness. There is no guarding or rebound.  Musculoskeletal:        General: No tenderness.     Cervical back: Normal range of motion and neck supple. No rigidity.  Lymphadenopathy:     Cervical: No cervical adenopathy.  Skin:    Findings: No erythema or rash.  Neurological:     Cranial Nerves: No cranial nerve deficit.     Motor: No abnormal muscle tone.     Coordination: Coordination normal.     Deep Tendon Reflexes: Reflexes normal.  Psychiatric:        Behavior: Behavior normal.        Thought Content: Thought content normal.        Judgment: Judgment normal.     Lab Results  Component Value Date   WBC 4.1 10/24/2021   HGB 13.1 10/24/2021   HCT 39.9 10/24/2021   PLT 267.0 10/24/2021   GLUCOSE 102 (H) 01/19/2022   CHOL 159 10/24/2021   TRIG 51.0 10/24/2021   HDL 66.60 10/24/2021   LDLCALC 82 10/24/2021   ALT 12 01/19/2022   AST 14 01/19/2022   NA 140 01/19/2022   K 3.8 01/19/2022   CL 105 01/19/2022   CREATININE 0.88 01/19/2022   BUN 20 01/19/2022   CO2 29 01/19/2022   TSH 1.21 10/24/2021   INR 1.1 (H) 09/17/2013   HGBA1C 6.1 01/19/2022    MM 3D SCREEN BREAST  BILATERAL  Result Date: 08/23/2021 CLINICAL DATA:  Screening. EXAM: DIGITAL SCREENING BILATERAL MAMMOGRAM WITH TOMOSYNTHESIS AND CAD TECHNIQUE: Bilateral screening digital craniocaudal and mediolateral oblique mammograms were obtained. Bilateral screening digital breast tomosynthesis was performed. The images were evaluated with computer-aided detection. COMPARISON:  Previous exam(s). ACR Breast Density Category c: The breast tissue is heterogeneously dense, which may obscure small masses. FINDINGS: There are no findings suspicious for malignancy. IMPRESSION: No mammographic evidence of malignancy. A result letter of this screening mammogram will be mailed directly to the patient. RECOMMENDATION: Screening mammogram in one year. (Code:SM-B-01Y) BI-RADS CATEGORY  1: Negative. Electronically Signed   By: Ammie Ferrier M.D.   On: 08/23/2021 11:41    Assessment & Plan:   Problem List Items Addressed This Visit     Depression    Cont on Effexor      Hyperglycemia    Doing well Low carb diet, fasting      Relevant Orders   Basic metabolic panel   Hemoglobin A1c   Vitamin D deficiency    Low Vitamin D levels - please, start Vit D prescription 50000 iu weekly (Rx emailed to your pharmacy) followed by over-the-counter Vit D 2000 iu daily.          Meds ordered this encounter  Medications   Vitamin D, Ergocalciferol, (DRISDOL) 1.25 MG (50000 UNIT) CAPS capsule    Sig: Take 1 capsule (50,000 Units total) by mouth every 7 (seven) days.    Dispense:  6 capsule    Refill:  0      Follow-up: Return in about 6 months (around 07/24/2022) for a follow-up visit.  Walker Kehr, MD

## 2022-05-02 IMAGING — MG MM DIGITAL SCREENING BILAT W/ TOMO AND CAD
8 series · 8 of 24 positions shown · non-contrast
Comparison: Previous exam(s).

CLINICAL DATA: Screening.

EXAM:
DIGITAL SCREENING BILATERAL MAMMOGRAM WITH TOMOSYNTHESIS AND CAD
TECHNIQUE: Bilateral screening digital craniocaudal and mediolateral oblique
mammograms were obtained. Bilateral screening digital breast
tomosynthesis was performed. The images were evaluated with
computer-aided detection.

[R MLO synth-2D]
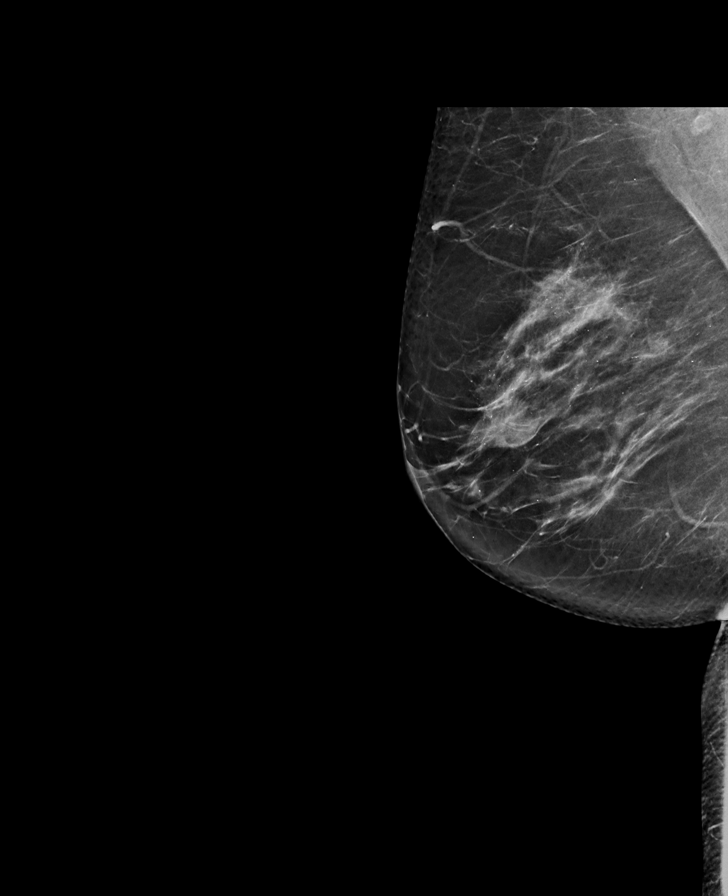

[L MLO synth-2D]
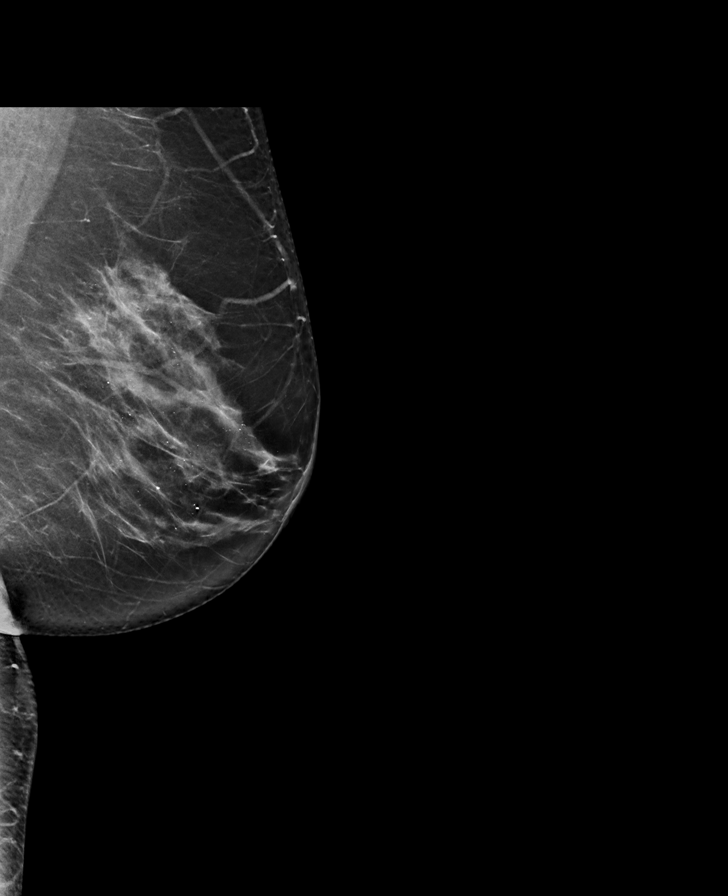

[R CC synth-2D]
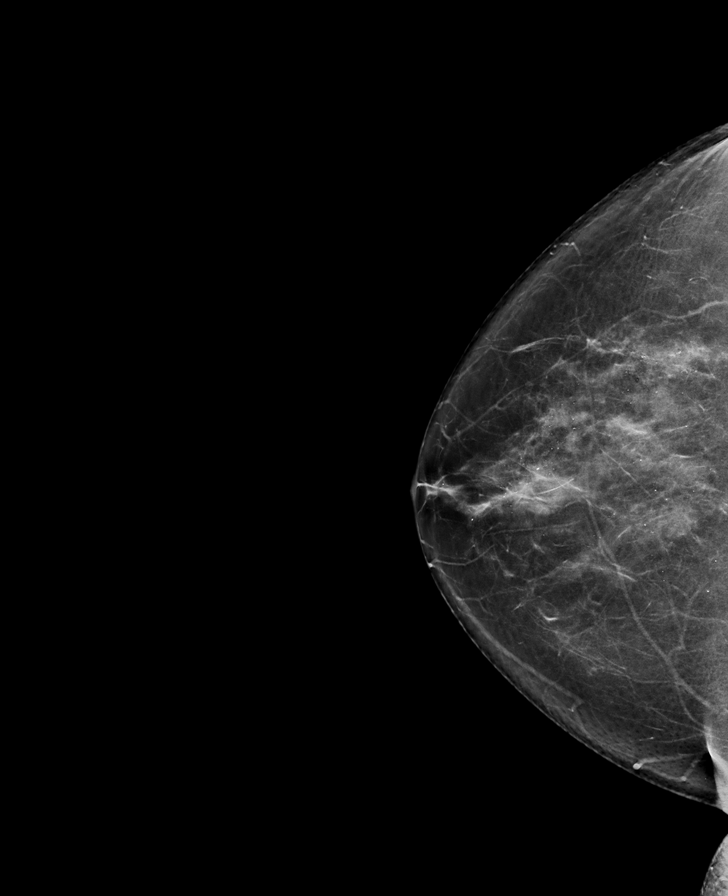

[L CC synth-2D]
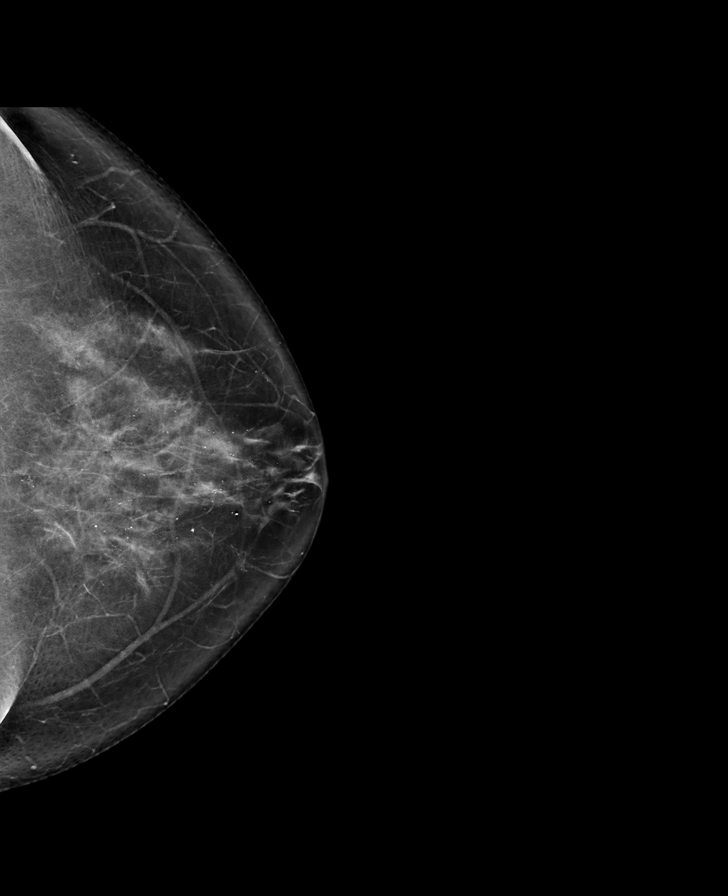

[L CC tomo · tomo slice 41/80.0]
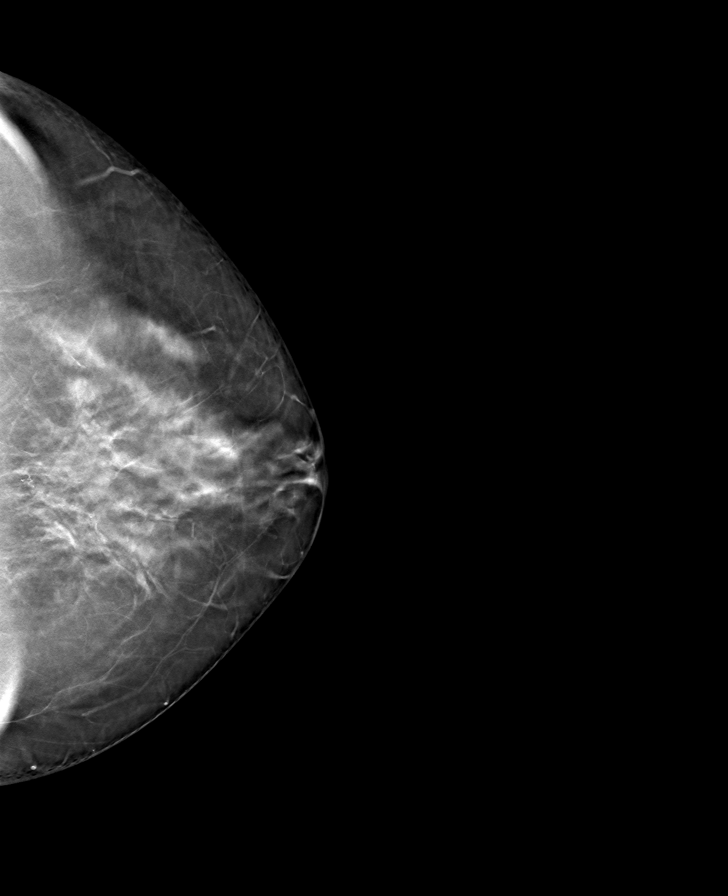

[R CC tomo · tomo slice 38/75.0]
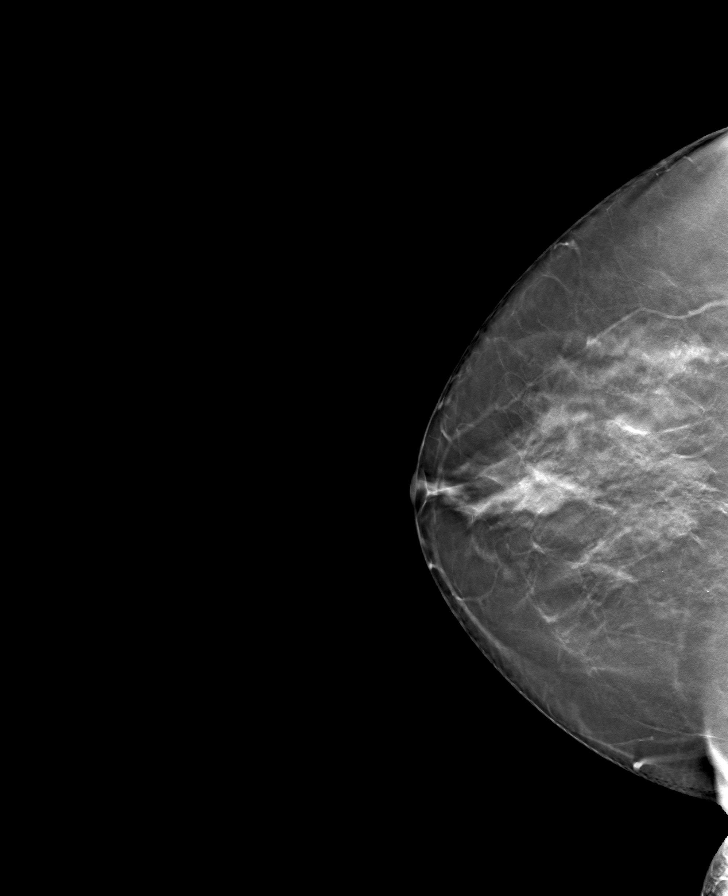

[R MLO tomo · tomo slice 39/78.0]
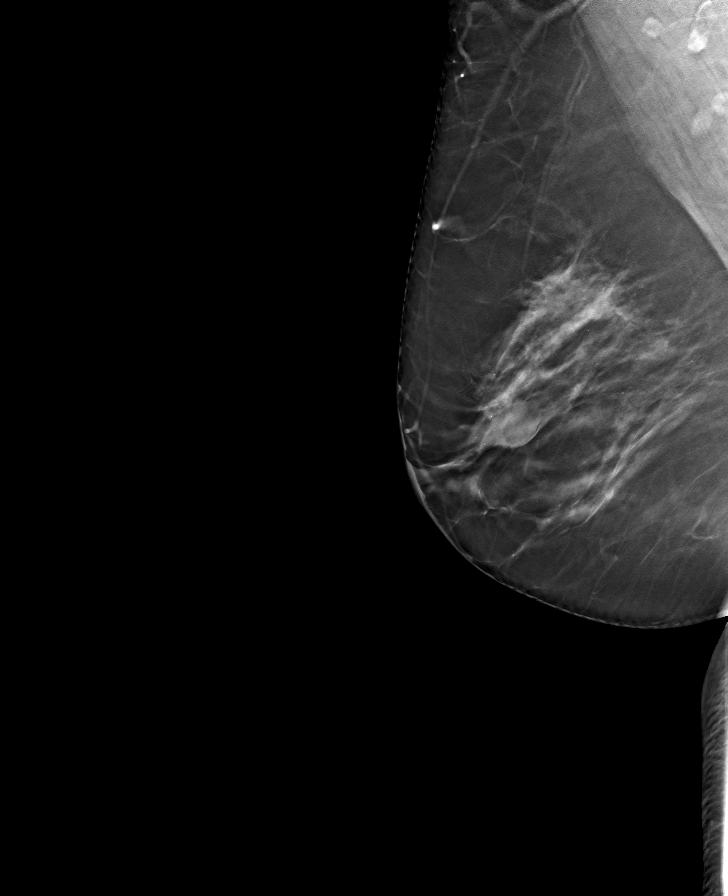

[L MLO tomo · tomo slice 39/78.0]
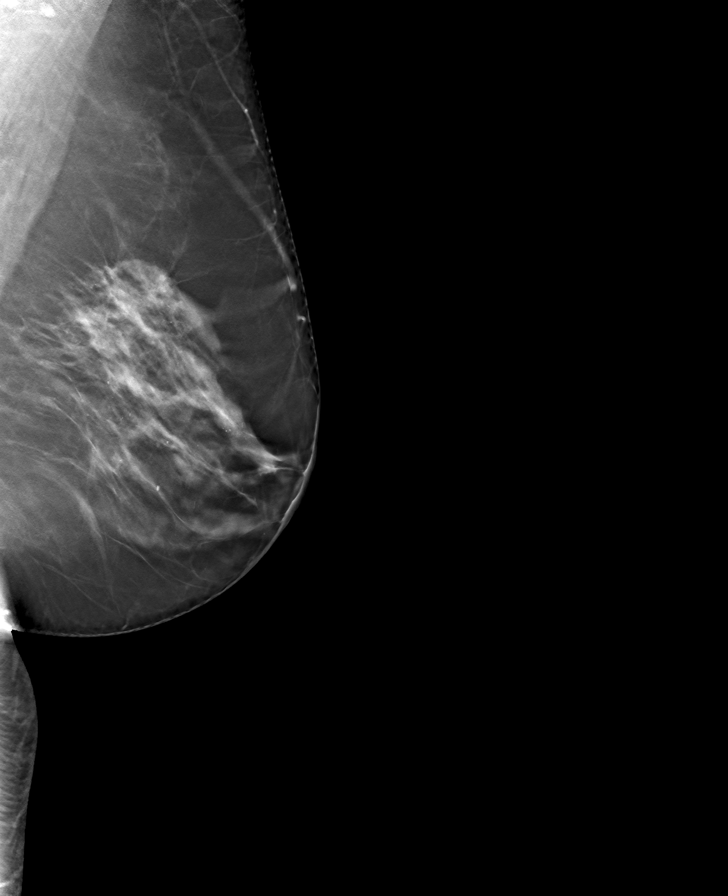

[8 of 24 positions shown; findings below may reference images not displayed]

ACR Breast Density Category c: The breast tissue is heterogeneously
dense, which may obscure small masses.
FINDINGS: There are no findings suspicious for malignancy.
IMPRESSION: No mammographic evidence of malignancy. A result letter of this
screening mammogram will be mailed directly to the patient.

RECOMMENDATION:
Screening mammogram in one year. (Code:Q3-W-BC3)

BI-RADS CATEGORY  1: Negative.

## 2022-05-24 ENCOUNTER — Encounter: Payer: Self-pay | Admitting: Internal Medicine

## 2022-05-24 ENCOUNTER — Ambulatory Visit: Payer: BC Managed Care – PPO | Admitting: Internal Medicine

## 2022-05-24 DIAGNOSIS — F32A Depression, unspecified: Secondary | ICD-10-CM | POA: Diagnosis not present

## 2022-05-24 DIAGNOSIS — F439 Reaction to severe stress, unspecified: Secondary | ICD-10-CM

## 2022-05-24 DIAGNOSIS — E559 Vitamin D deficiency, unspecified: Secondary | ICD-10-CM

## 2022-05-24 MED ORDER — ALPRAZOLAM 0.25 MG PO TABS: 0.2500 mg | ORAL_TABLET | Freq: Two times a day (BID) | ORAL | 2 refills | Status: DC | PRN

## 2022-05-24 MED ORDER — VENLAFAXINE HCL ER 37.5 MG PO CP24: 75.0000 mg | ORAL_CAPSULE | Freq: Every day | ORAL | 5 refills | Status: DC

## 2022-05-24 NOTE — Assessment & Plan Note (Addendum)
Worse Cont on Effexor XR - increase to 75 mg/d Start Xanax prn  Potential benefits of a long term benzodiazepines  use as well as potential risks  and complications were explained to the patient and were aknowledged.

## 2022-05-24 NOTE — Assessment & Plan Note (Signed)
On Vit D 

## 2022-05-24 NOTE — Progress Notes (Signed)
Subjective:  Patient ID: Jody Ford, female    DOB: 03/03/1966  Age: 56 y.o. MRN: [id number]  CC: No chief complaint on file.   HPI Jody Ford presents for a marital stress - husband got baptized in [place] [employer]   Outpatient Medications Prior to Visit  Medication Sig Dispense Refill  . aspirin 81 MG tablet Take 81 mg by mouth daily.    . Multiple Vitamin (MULTIVITAMIN) tablet Take 1 tablet by mouth daily.    . pentoxifylline (TRENTAL) 400 MG CR tablet Take 400 mg by mouth 3 (three) times daily. Take 1 by mouth 3 (three) times daily.    Marland Kitchen venlafaxine XR (EFFEXOR-XR) 37.5 MG 24 hr capsule Take by mouth daily.    . Vitamin D, Ergocalciferol, (DRISDOL) 1.25 MG (50000 UNIT) CAPS capsule Take 1 capsule (50,000 Units total) by mouth every 7 (seven) days. 6 capsule 0   No facility-administered medications prior to visit.    ROS: Review of Systems  Constitutional:  Negative for activity change, appetite change, chills, fatigue and unexpected weight change.  HENT:  Negative for congestion, mouth sores and sinus pressure.   Eyes:  Negative for visual disturbance.  Respiratory:  Negative for cough and chest tightness.   Gastrointestinal:  Negative for abdominal pain and nausea.  Genitourinary:  Negative for difficulty urinating, frequency and vaginal pain.  Musculoskeletal:  Negative for back pain and gait problem.  Skin:  Negative for pallor and rash.  Neurological:  Negative for dizziness, tremors, weakness, numbness and headaches.  Psychiatric/Behavioral:  Negative for confusion and sleep disturbance.     Objective:  BP 118/78 (BP Location: Left Arm, Patient Position: Sitting, Cuff Size: Normal)   Pulse 66   Temp 98.5 F (36.9 C) (Oral)   Ht '5\' 4"'$  (1.626 m)   Wt 153 lb (69.4 kg)   SpO2 98%   BMI 26.26 kg/m   BP Readings from Last 3 Encounters:  05/24/22 118/78  01/24/22 112/70  10/24/21 112/72    Wt Readings from Last 3 Encounters:  05/24/22 153 lb (69.4 kg)   01/24/22 153 lb (69.4 kg)  10/24/21 171 lb 12.8 oz (77.9 kg)    Physical Exam Constitutional:      General: She is not in acute distress.    Appearance: She is well-developed.  HENT:     Head: Normocephalic.     Right Ear: External ear normal.     Left Ear: External ear normal.     Nose: Nose normal.  Eyes:     General:        Right eye: No discharge.        Left eye: No discharge.     Conjunctiva/sclera: Conjunctivae normal.     Pupils: Pupils are equal, round, and reactive to light.  Neck:     Thyroid: No thyromegaly.     Vascular: No JVD.     Trachea: No tracheal deviation.  Cardiovascular:     Rate and Rhythm: Normal rate and regular rhythm.     Heart sounds: Normal heart sounds.  Pulmonary:     Effort: No respiratory distress.     Breath sounds: No stridor. No wheezing.  Abdominal:     General: Bowel sounds are normal. There is no distension.     Palpations: Abdomen is soft. There is no mass.     Tenderness: There is no abdominal tenderness. There is no guarding or rebound.  Musculoskeletal:        General: No  tenderness.     Cervical back: Normal range of motion and neck supple. No rigidity.  Lymphadenopathy:     Cervical: No cervical adenopathy.  Skin:    Findings: No erythema or rash.  Neurological:     Cranial Nerves: No cranial nerve deficit.     Motor: No abnormal muscle tone.     Coordination: Coordination normal.     Deep Tendon Reflexes: Reflexes normal.  Psychiatric:        Behavior: Behavior normal.        Thought Content: Thought content normal.        Judgment: Judgment normal.    A total time of 30 minutes was spent preparing to see the patient, obtaining history and performing physical exam.  Additionally, counseling the patient regarding the above listed issues.     Lab Results  Component Value Date   WBC 4.1 10/24/2021   HGB 13.1 10/24/2021   HCT 39.9 10/24/2021   PLT 267.0 10/24/2021   GLUCOSE 102 (H) 01/19/2022   CHOL 159 10/24/2021    TRIG 51.0 10/24/2021   HDL 66.60 10/24/2021   LDLCALC 82 10/24/2021   ALT 12 01/19/2022   AST 14 01/19/2022   NA 140 01/19/2022   K 3.8 01/19/2022   CL 105 01/19/2022   CREATININE 0.88 01/19/2022   BUN 20 01/19/2022   CO2 29 01/19/2022   TSH 1.21 10/24/2021   INR 1.1 (H) 09/17/2013   HGBA1C 6.1 01/19/2022    MM 3D SCREEN BREAST BILATERAL  Result Date: 08/23/2021 CLINICAL DATA:  Screening. EXAM: DIGITAL SCREENING BILATERAL MAMMOGRAM WITH TOMOSYNTHESIS AND CAD TECHNIQUE: Bilateral screening digital craniocaudal and mediolateral oblique mammograms were obtained. Bilateral screening digital breast tomosynthesis was performed. The images were evaluated with computer-aided detection. COMPARISON:  Previous exam(s). ACR Breast Density Category c: The breast tissue is heterogeneously dense, which may obscure small masses. FINDINGS: There are no findings suspicious for malignancy. IMPRESSION: No mammographic evidence of malignancy. A result letter of this screening mammogram will be mailed directly to the patient. RECOMMENDATION: Screening mammogram in one year. (Code:SM-B-01Y) BI-RADS CATEGORY  1: Negative. Electronically Signed   By: Ammie Ferrier M.D.   On: 08/23/2021 11:41    Assessment & Plan:   Problem List Items Addressed This Visit     Depression    Cont on Effexor      Relevant Medications   ALPRAZolam (XANAX) 0.25 MG tablet   venlafaxine XR (EFFEXOR-XR) 37.5 MG 24 hr capsule      Meds ordered this encounter  Medications  . ALPRAZolam (XANAX) 0.25 MG tablet    Sig: Take 1 tablet (0.25 mg total) by mouth 2 (two) times daily as needed for anxiety.    Dispense:  60 tablet    Refill:  2  . venlafaxine XR (EFFEXOR-XR) 37.5 MG 24 hr capsule    Sig: Take 2 capsules (75 mg total) by mouth daily.    Dispense:  60 capsule    Refill:  5      Follow-up: Return in about 3 months (around 08/24/2022) for a follow-up visit.  Walker Kehr, MD

## 2022-05-24 NOTE — Assessment & Plan Note (Signed)
Xanax prn  Potential benefits of a long term benzodiazepines  use as well as potential risks  and complications were explained to the patient and were aknowledged. 

## 2022-06-15 ENCOUNTER — Other Ambulatory Visit: Payer: Self-pay | Admitting: Internal Medicine

## 2022-07-10 ENCOUNTER — Other Ambulatory Visit: Payer: Self-pay | Admitting: Obstetrics and Gynecology

## 2022-07-10 ENCOUNTER — Other Ambulatory Visit: Payer: Self-pay | Admitting: Internal Medicine

## 2022-07-10 DIAGNOSIS — Z1231 Encounter for screening mammogram for malignant neoplasm of breast: Secondary | ICD-10-CM

## 2022-07-20 ENCOUNTER — Other Ambulatory Visit (INDEPENDENT_AMBULATORY_CARE_PROVIDER_SITE_OTHER): Payer: BC Managed Care – PPO

## 2022-07-20 DIAGNOSIS — R739 Hyperglycemia, unspecified: Secondary | ICD-10-CM | POA: Diagnosis not present

## 2022-07-20 LAB — BASIC METABOLIC PANEL
BUN: 23 mg/dL (ref 6–23)
CO2: 27 mEq/L (ref 19–32)
Calcium: 9.2 mg/dL (ref 8.4–10.5)
Chloride: 105 mEq/L (ref 96–112)
Creatinine, Ser: 0.89 mg/dL (ref 0.40–1.20)
GFR: 72.37 mL/min (ref >60.00)
Glucose, Bld: 103 mg/dL — ABNORMAL HIGH (ref 70–99)
Potassium: 4.1 mEq/L (ref 3.5–5.1)
Sodium: 139 mEq/L (ref 135–145)

## 2022-07-20 LAB — HEMOGLOBIN A1C: Hgb A1c MFr Bld: 6.3 % (ref 4.6–6.5)

## 2022-07-25 ENCOUNTER — Encounter: Payer: Self-pay | Admitting: Internal Medicine

## 2022-07-25 ENCOUNTER — Ambulatory Visit: Payer: BC Managed Care – PPO | Admitting: Internal Medicine

## 2022-07-25 VITALS — BP 120/78 | HR 75 | Temp 97.9°F | Ht 64.0 in | Wt 153.0 lb

## 2022-07-25 DIAGNOSIS — I83029 Varicose veins of left lower extremity with ulcer of unspecified site: Secondary | ICD-10-CM | POA: Diagnosis not present

## 2022-07-25 DIAGNOSIS — R739 Hyperglycemia, unspecified: Secondary | ICD-10-CM

## 2022-07-25 DIAGNOSIS — F32A Depression, unspecified: Secondary | ICD-10-CM

## 2022-07-25 DIAGNOSIS — F439 Reaction to severe stress, unspecified: Secondary | ICD-10-CM

## 2022-07-25 DIAGNOSIS — D682 Hereditary deficiency of other clotting factors: Secondary | ICD-10-CM

## 2022-07-25 DIAGNOSIS — E559 Vitamin D deficiency, unspecified: Secondary | ICD-10-CM

## 2022-07-25 DIAGNOSIS — Z0000 Encounter for general adult medical examination without abnormal findings: Secondary | ICD-10-CM

## 2022-07-25 DIAGNOSIS — L97929 Non-pressure chronic ulcer of unspecified part of left lower leg with unspecified severity: Secondary | ICD-10-CM

## 2022-07-25 MED ORDER — PENTOXIFYLLINE ER 400 MG PO TBCR: 400.0000 mg | EXTENDED_RELEASE_TABLET | Freq: Three times a day (TID) | ORAL | 3 refills | Status: DC

## 2022-07-25 MED ORDER — VENLAFAXINE HCL ER 37.5 MG PO CP24: 37.5000 mg | ORAL_CAPSULE | Freq: Every day | ORAL | 3 refills | Status: DC

## 2022-07-25 NOTE — Assessment & Plan Note (Signed)
On Trental tid for prevention

## 2022-07-25 NOTE — Assessment & Plan Note (Addendum)
On ASA 81 mg/d

## 2022-07-25 NOTE — Progress Notes (Signed)
Subjective:  Patient ID: Jody Ford, female    DOB: 07-23-1966  Age: 56 y.o. MRN: [id number]  CC: No chief complaint on file.   HPI ALLYNE HEBERT presents for stress, anxiety, venous ulcers  Outpatient Medications Prior to Visit  Medication Sig Dispense Refill   ALPRAZolam (XANAX) 0.25 MG tablet Take 1 tablet (0.25 mg total) by mouth 2 (two) times daily as needed for anxiety. 60 tablet 2   aspirin 81 MG tablet Take 81 mg by mouth daily.     Multiple Vitamin (MULTIVITAMIN) tablet Take 1 tablet by mouth daily.     pentoxifylline (TRENTAL) 400 MG CR tablet Take 400 mg by mouth 3 (three) times daily. Take 1 by mouth 3 (three) times daily.     venlafaxine XR (EFFEXOR-XR) 37.5 MG 24 hr capsule TAKE 2 CAPSULES BY MOUTH DAILY. 180 capsule 2   No facility-administered medications prior to visit.    ROS: Review of Systems  Constitutional:  Negative for activity change, appetite change, chills, fatigue and unexpected weight change.  HENT:  Negative for congestion, mouth sores and sinus pressure.   Eyes:  Negative for visual disturbance.  Respiratory:  Negative for cough and chest tightness.   Gastrointestinal:  Negative for abdominal pain and nausea.  Genitourinary:  Negative for difficulty urinating, frequency and vaginal pain.  Musculoskeletal:  Negative for back pain and gait problem.  Skin:  Positive for color change. Negative for pallor, rash and wound.  Neurological:  Negative for dizziness, tremors, weakness, numbness and headaches.  Psychiatric/Behavioral:  Negative for confusion and sleep disturbance.     Objective:  BP 120/78 (BP Location: Left Arm, Patient Position: Sitting, Cuff Size: Normal)   Pulse 75   Temp 97.9 F (36.6 C) (Oral)   Ht '5\' 4"'$  (1.626 m)   Wt 153 lb (69.4 kg)   SpO2 98%   BMI 26.26 kg/m   BP Readings from Last 3 Encounters:  07/25/22 120/78  05/24/22 118/78  01/24/22 112/70    Wt Readings from Last 3 Encounters:  07/25/22 153 lb  (69.4 kg)  05/24/22 153 lb (69.4 kg)  01/24/22 153 lb (69.4 kg)    Physical Exam Constitutional:      General: She is not in acute distress.    Appearance: Normal appearance. She is well-developed.  HENT:     Head: Normocephalic.     Right Ear: External ear normal.     Left Ear: External ear normal.     Nose: Nose normal.  Eyes:     General:        Right eye: No discharge.        Left eye: No discharge.     Conjunctiva/sclera: Conjunctivae normal.     Pupils: Pupils are equal, round, and reactive to light.  Neck:     Thyroid: No thyromegaly.     Vascular: No JVD.     Trachea: No tracheal deviation.  Cardiovascular:     Rate and Rhythm: Normal rate and regular rhythm.     Heart sounds: Normal heart sounds.  Pulmonary:     Effort: No respiratory distress.     Breath sounds: No stridor. No wheezing.  Abdominal:     General: Bowel sounds are normal. There is no distension.     Palpations: Abdomen is soft. There is no mass.     Tenderness: There is no abdominal tenderness. There is no guarding or rebound.  Musculoskeletal:        General: No  tenderness.     Cervical back: Normal range of motion and neck supple. No rigidity.  Lymphadenopathy:     Cervical: No cervical adenopathy.  Skin:    Findings: No erythema or rash.  Neurological:     Cranial Nerves: No cranial nerve deficit.     Motor: No abnormal muscle tone.     Coordination: Coordination normal.     Deep Tendon Reflexes: Reflexes normal.  Psychiatric:        Behavior: Behavior normal.        Thought Content: Thought content normal.        Judgment: Judgment normal.   L ankle w/discoloration  Lab Results  Component Value Date   WBC 4.1 10/24/2021   HGB 13.1 10/24/2021   HCT 39.9 10/24/2021   PLT 267.0 10/24/2021   GLUCOSE 103 (H) 07/20/2022   CHOL 159 10/24/2021   TRIG 51.0 10/24/2021   HDL 66.60 10/24/2021   LDLCALC 82 10/24/2021   ALT 12 01/19/2022   AST 14 01/19/2022   NA 139 07/20/2022   K 4.1  07/20/2022   CL 105 07/20/2022   CREATININE 0.89 07/20/2022   BUN 23 07/20/2022   CO2 27 07/20/2022   TSH 1.21 10/24/2021   INR 1.1 (H) 09/17/2013   HGBA1C 6.3 07/20/2022    MM 3D SCREEN BREAST BILATERAL  Result Date: 08/23/2021 CLINICAL DATA:  Screening. EXAM: DIGITAL SCREENING BILATERAL MAMMOGRAM WITH TOMOSYNTHESIS AND CAD TECHNIQUE: Bilateral screening digital craniocaudal and mediolateral oblique mammograms were obtained. Bilateral screening digital breast tomosynthesis was performed. The images were evaluated with computer-aided detection. COMPARISON:  Previous exam(s). ACR Breast Density Category c: The breast tissue is heterogeneously dense, which may obscure small masses. FINDINGS: There are no findings suspicious for malignancy. IMPRESSION: No mammographic evidence of malignancy. A result letter of this screening mammogram will be mailed directly to the patient. RECOMMENDATION: Screening mammogram in one year. (Code:SM-B-01Y) BI-RADS CATEGORY  1: Negative. Electronically Signed   By: Ammie Ferrier M.D.   On: 08/23/2021 11:41    Assessment & Plan:   Problem List Items Addressed This Visit     Depression    Better Cont on Effexor XR -37.5 mg/d      Relevant Medications   venlafaxine XR (EFFEXOR-XR) 37.5 MG 24 hr capsule   Factor V deficiency (HCC)    On ASA 81 mg/d       Hyperglycemia   Relevant Orders   Hemoglobin A1c   Stress at home - Primary    Better      Venous ulcer of leg (HCC)    On Trental tid for prevention      Vitamin D deficiency    Cont on Vit D      Relevant Orders   VITAMIN D 25 Hydroxy (Vit-D Deficiency, Fractures)   Well adult exam   Relevant Orders   TSH   Urinalysis   CBC with Differential/Platelet   Lipid panel   Comprehensive metabolic panel   Hemoglobin A1c   VITAMIN D 25 Hydroxy (Vit-D Deficiency, Fractures)      Meds ordered this encounter  Medications   pentoxifylline (TRENTAL) 400 MG CR tablet    Sig: Take 1 tablet  (400 mg total) by mouth 3 (three) times daily. Take 1 by mouth 3 (three) times daily.    Dispense:  270 tablet    Refill:  3   venlafaxine XR (EFFEXOR-XR) 37.5 MG 24 hr capsule    Sig: Take 1 capsule (37.5 mg total) by  mouth daily.    Dispense:  90 capsule    Refill:  3      Follow-up: Return in about 3 months (around 10/25/2022) for Wellness Exam.  Walker Kehr, MD

## 2022-07-25 NOTE — Assessment & Plan Note (Signed)
Better Cont on Effexor XR -37.5 mg/d

## 2022-07-25 NOTE — Assessment & Plan Note (Signed)
Cont on Vit D 

## 2022-07-25 NOTE — Assessment & Plan Note (Signed)
Better  

## 2022-08-22 ENCOUNTER — Ambulatory Visit: Payer: BC Managed Care – PPO

## 2022-08-23 ENCOUNTER — Ambulatory Visit
Admission: RE | Admit: 2022-08-23 | Discharge: 2022-08-23 | Disposition: A | Discharge status: Home / Self Care | Payer: BC Managed Care – PPO | Source: Ambulatory Visit | Attending: Obstetrics and Gynecology | Admitting: Obstetrics and Gynecology

## 2022-08-23 DIAGNOSIS — Z1231 Encounter for screening mammogram for malignant neoplasm of breast: Secondary | ICD-10-CM

## 2022-10-18 ENCOUNTER — Other Ambulatory Visit (INDEPENDENT_AMBULATORY_CARE_PROVIDER_SITE_OTHER): Payer: BC Managed Care – PPO

## 2022-10-18 DIAGNOSIS — E559 Vitamin D deficiency, unspecified: Secondary | ICD-10-CM | POA: Diagnosis not present

## 2022-10-18 DIAGNOSIS — R739 Hyperglycemia, unspecified: Secondary | ICD-10-CM

## 2022-10-18 DIAGNOSIS — Z0000 Encounter for general adult medical examination without abnormal findings: Secondary | ICD-10-CM

## 2022-10-18 LAB — CBC WITH DIFFERENTIAL/PLATELET
Basophils Absolute: 0 10*3/uL (ref 0.0–0.1)
Basophils Relative: 0.6 % (ref 0.0–3.0)
Eosinophils Absolute: 0.2 10*3/uL (ref 0.0–0.7)
Eosinophils Relative: 3.8 % (ref 0.0–5.0)
HCT: 38.1 % (ref 36.0–46.0)
Hemoglobin: 12.6 g/dL (ref 12.0–15.0)
Lymphocytes Relative: 28.5 % (ref 12.0–46.0)
Lymphs Abs: 1.3 10*3/uL (ref 0.7–4.0)
MCHC: 33 g/dL (ref 30.0–36.0)
MCV: 94.9 fl (ref 78.0–100.0)
Monocytes Absolute: 0.4 10*3/uL (ref 0.1–1.0)
Monocytes Relative: 9.4 % (ref 3.0–12.0)
Neutro Abs: 2.6 10*3/uL (ref 1.4–7.7)
Neutrophils Relative %: 57.7 % (ref 43.0–77.0)
Platelets: 253 10*3/uL (ref 150.0–400.0)
RBC: 4.02 Mil/uL (ref 3.87–5.11)
RDW: 13.1 % (ref 11.5–15.5)
WBC: 4.5 10*3/uL (ref 4.0–10.5)

## 2022-10-18 LAB — TSH: TSH: 0.88 u[IU]/mL (ref 0.35–5.50)

## 2022-10-18 LAB — URINALYSIS, ROUTINE W REFLEX MICROSCOPIC
Bilirubin Urine: NEGATIVE
Hgb urine dipstick: NEGATIVE
Ketones, ur: NEGATIVE
Nitrite: NEGATIVE
RBC / HPF: NONE SEEN (ref 0–?)
Specific Gravity, Urine: 1.025 (ref 1.000–1.030)
Total Protein, Urine: NEGATIVE
Urine Glucose: NEGATIVE
Urobilinogen, UA: 0.2 (ref 0.0–1.0)
pH: 5.5 (ref 5.0–8.0)

## 2022-10-18 LAB — LIPID PANEL
Cholesterol: 165 mg/dL (ref 0–200)
HDL: 74.2 mg/dL (ref >39.00)
LDL Cholesterol: 82 mg/dL (ref 0–99)
NonHDL: 90.79
Total CHOL/HDL Ratio: 2
Triglycerides: 46 mg/dL (ref 0.0–149.0)
VLDL: 9.2 mg/dL (ref 0.0–40.0)

## 2022-10-18 LAB — COMPREHENSIVE METABOLIC PANEL
ALT: 12 U/L (ref 0–35)
AST: 16 U/L (ref 0–37)
Albumin: 4.3 g/dL (ref 3.5–5.2)
Alkaline Phosphatase: 72 U/L (ref 39–117)
BUN: 21 mg/dL (ref 6–23)
CO2: 28 mEq/L (ref 19–32)
Calcium: 9 mg/dL (ref 8.4–10.5)
Chloride: 105 mEq/L (ref 96–112)
Creatinine, Ser: 0.83 mg/dL (ref 0.40–1.20)
GFR: 78.55 mL/min (ref >60.00)
Glucose, Bld: 106 mg/dL — ABNORMAL HIGH (ref 70–99)
Potassium: 4.1 mEq/L (ref 3.5–5.1)
Sodium: 139 mEq/L (ref 135–145)
Total Bilirubin: 0.4 mg/dL (ref 0.2–1.2)
Total Protein: 6.6 g/dL (ref 6.0–8.3)

## 2022-10-18 LAB — HEMOGLOBIN A1C: Hgb A1c MFr Bld: 6.2 % (ref 4.6–6.5)

## 2022-10-18 LAB — VITAMIN D 25 HYDROXY (VIT D DEFICIENCY, FRACTURES): VITD: 41.85 ng/mL (ref 30.00–100.00)

## 2022-10-19 ENCOUNTER — Telehealth: Payer: Self-pay

## 2022-10-19 NOTE — Telephone Encounter (Signed)
The urinalysis was normal.  Thank you

## 2022-10-19 NOTE — Telephone Encounter (Signed)
Patient is calling in stating that the results from the urinalysis show that she has a urinary tract infection. Wondered if she can get something called in before her physical next week.

## 2022-10-22 NOTE — Telephone Encounter (Signed)
Notified pt w/MD response.../lmb 

## 2022-10-25 ENCOUNTER — Encounter: Payer: Self-pay | Admitting: Internal Medicine

## 2022-10-25 ENCOUNTER — Ambulatory Visit (INDEPENDENT_AMBULATORY_CARE_PROVIDER_SITE_OTHER): Payer: BC Managed Care – PPO | Admitting: Internal Medicine

## 2022-10-25 ENCOUNTER — Other Ambulatory Visit: Payer: BC Managed Care – PPO

## 2022-10-25 VITALS — BP 122/78 | HR 60 | Temp 98.4°F | Ht 64.0 in | Wt 138.0 lb

## 2022-10-25 DIAGNOSIS — I83029 Varicose veins of left lower extremity with ulcer of unspecified site: Secondary | ICD-10-CM

## 2022-10-25 DIAGNOSIS — Z0000 Encounter for general adult medical examination without abnormal findings: Secondary | ICD-10-CM

## 2022-10-25 DIAGNOSIS — F32A Depression, unspecified: Secondary | ICD-10-CM | POA: Diagnosis not present

## 2022-10-25 DIAGNOSIS — Z23 Encounter for immunization: Secondary | ICD-10-CM | POA: Diagnosis not present

## 2022-10-25 DIAGNOSIS — D682 Hereditary deficiency of other clotting factors: Secondary | ICD-10-CM | POA: Diagnosis not present

## 2022-10-25 DIAGNOSIS — L97929 Non-pressure chronic ulcer of unspecified part of left lower leg with unspecified severity: Secondary | ICD-10-CM

## 2022-10-25 NOTE — Assessment & Plan Note (Signed)
Blue-Emu cream was recommended to use 2-3 times a day Try Arnica

## 2022-10-25 NOTE — Assessment & Plan Note (Signed)
We discussed age appropriate health related issues, including available/recomended screening tests and vaccinations. We discussed a need for adhering to healthy diet and exercise. Labs were ordered to be later reviewed . All questions were answered. PAP per GYN Colonoscopy - 2019, due 2024 (polyp) Shingrix discussed No fam h/o CAD

## 2022-10-25 NOTE — Assessment & Plan Note (Signed)
Doing well 

## 2022-10-25 NOTE — Progress Notes (Signed)
Subjective:  Patient ID: Jody Ford, female    DOB: Apr 26, 1966  Age: 56 y.o. MRN: [id number]  CC: Annual Exam   HPI NOVEMBER SYPHER presents for a well exam  Outpatient Medications Prior to Visit  Medication Sig Dispense Refill   ALPRAZolam (XANAX) 0.25 MG tablet Take 1 tablet (0.25 mg total) by mouth 2 (two) times daily as needed for anxiety. 60 tablet 2   aspirin 81 MG tablet Take 81 mg by mouth daily.     Multiple Vitamin (MULTIVITAMIN) tablet Take 1 tablet by mouth daily.     pentoxifylline (TRENTAL) 400 MG CR tablet Take 1 tablet (400 mg total) by mouth 3 (three) times daily. Take 1 by mouth 3 (three) times daily. 270 tablet 3   venlafaxine XR (EFFEXOR-XR) 37.5 MG 24 hr capsule Take 1 capsule (37.5 mg total) by mouth daily. 90 capsule 3   No facility-administered medications prior to visit.    ROS: Review of Systems  Constitutional:  Negative for activity change, appetite change, chills, fatigue and unexpected weight change.  HENT:  Negative for congestion, mouth sores and sinus pressure.   Eyes:  Negative for visual disturbance.  Respiratory:  Negative for cough and chest tightness.   Gastrointestinal:  Negative for abdominal pain and nausea.  Genitourinary:  Negative for difficulty urinating, frequency and vaginal pain.  Musculoskeletal:  Negative for back pain and gait problem.  Skin:  Negative for pallor and rash.  Neurological:  Negative for dizziness, tremors, weakness, numbness and headaches.  Psychiatric/Behavioral:  Negative for confusion and sleep disturbance.     Objective:  BP 122/78 (BP Location: Right Arm, Patient Position: Sitting, Cuff Size: Normal)   Pulse 60   Temp 98.4 F (36.9 C) (Oral)   Ht '5\' 4"'$  (1.626 m)   Wt 138 lb (62.6 kg)   SpO2 99%   BMI 23.69 kg/m   BP Readings from Last 3 Encounters:  10/25/22 122/78  07/25/22 120/78  05/24/22 118/78    Wt Readings from Last 3 Encounters:  10/25/22 138 lb (62.6 kg)  07/25/22 153 lb  (69.4 kg)  05/24/22 153 lb (69.4 kg)    Physical Exam Constitutional:      General: She is not in acute distress.    Appearance: Normal appearance. She is well-developed.  HENT:     Head: Normocephalic.     Right Ear: External ear normal.     Left Ear: External ear normal.     Nose: Nose normal.  Eyes:     General:        Right eye: No discharge.        Left eye: No discharge.     Conjunctiva/sclera: Conjunctivae normal.     Pupils: Pupils are equal, round, and reactive to light.  Neck:     Thyroid: No thyromegaly.     Vascular: No JVD.     Trachea: No tracheal deviation.  Cardiovascular:     Rate and Rhythm: Normal rate and regular rhythm.     Heart sounds: Normal heart sounds.  Pulmonary:     Effort: No respiratory distress.     Breath sounds: No stridor. No wheezing.  Abdominal:     General: Bowel sounds are normal. There is no distension.     Palpations: Abdomen is soft. There is no mass.     Tenderness: There is no abdominal tenderness. There is no guarding or rebound.  Musculoskeletal:        General: No tenderness.  Cervical back: Normal range of motion and neck supple. No rigidity.  Lymphadenopathy:     Cervical: No cervical adenopathy.  Skin:    Findings: No erythema or rash.  Neurological:     Cranial Nerves: No cranial nerve deficit.     Motor: No abnormal muscle tone.     Coordination: Coordination normal.     Deep Tendon Reflexes: Reflexes normal.  Psychiatric:        Behavior: Behavior normal.        Thought Content: Thought content normal.        Judgment: Judgment normal.     Lab Results  Component Value Date   WBC 4.5 10/18/2022   HGB 12.6 10/18/2022   HCT 38.1 10/18/2022   PLT 253.0 10/18/2022   GLUCOSE 106 (H) 10/18/2022   CHOL 165 10/18/2022   TRIG 46.0 10/18/2022   HDL 74.20 10/18/2022   LDLCALC 82 10/18/2022   ALT 12 10/18/2022   AST 16 10/18/2022   NA 139 10/18/2022   K 4.1 10/18/2022   CL 105 10/18/2022   CREATININE 0.83  10/18/2022   BUN 21 10/18/2022   CO2 28 10/18/2022   TSH 0.88 10/18/2022   INR 1.1 (H) 09/17/2013   HGBA1C 6.2 10/18/2022    MM 3D SCREEN BREAST BILATERAL  Result Date: 08/24/2022 CLINICAL DATA:  Screening. EXAM: DIGITAL SCREENING BILATERAL MAMMOGRAM WITH TOMOSYNTHESIS AND CAD TECHNIQUE: Bilateral screening digital craniocaudal and mediolateral oblique mammograms were obtained. Bilateral screening digital breast tomosynthesis was performed. The images were evaluated with computer-aided detection. COMPARISON:  Previous exam(s). ACR Breast Density Category c: The breast tissue is heterogeneously dense, which may obscure small masses. FINDINGS: There are no findings suspicious for malignancy. IMPRESSION: No mammographic evidence of malignancy. A result letter of this screening mammogram will be mailed directly to the patient. RECOMMENDATION: Screening mammogram in one year. (Code:SM-B-01Y) BI-RADS CATEGORY  1: Negative. Electronically Signed   By: Margarette Canada M.D.   On: 08/24/2022 07:55    Assessment & Plan:   Problem List Items Addressed This Visit     Factor V deficiency ([place])    ASA 81 mg/d Compr socks and 2 ASA for trips      Depression    Doing well      Well adult exam - Primary    We discussed age appropriate health related issues, including available/recomended screening tests and vaccinations. We discussed a need for adhering to healthy diet and exercise. Labs were ordered to be later reviewed . All questions were answered. PAP per GYN Colonoscopy - 2019, due 2024 (polyp) Shingrix discussed No fam h/o CAD         No orders of the defined types were placed in this encounter.     Follow-up: No follow-ups on file.  Walker Kehr, MD

## 2022-10-25 NOTE — Patient Instructions (Signed)
Try Arnica cream/gel  Blue-Emu cream --use 2-3 times a day

## 2022-10-25 NOTE — Assessment & Plan Note (Signed)
ASA 81 mg/d Compr socks and 2 ASA for trips

## 2022-10-25 NOTE — Addendum Note (Signed)
Addended by: Basil Dess on: 10/25/2022 09:35 AM   Modules accepted: Orders

## 2023-03-29 ENCOUNTER — Telehealth: Payer: Self-pay | Admitting: Internal Medicine

## 2023-03-29 DIAGNOSIS — Z0000 Encounter for general adult medical examination without abnormal findings: Secondary | ICD-10-CM

## 2023-03-29 DIAGNOSIS — R7309 Other abnormal glucose: Secondary | ICD-10-CM

## 2023-03-29 NOTE — Telephone Encounter (Signed)
Pt has lab appt 5/13 and PCP appt 5/15.  Please enter lab orders for this visit.

## 2023-04-05 NOTE — Telephone Encounter (Signed)
Okay.  Thanks.

## 2023-04-22 ENCOUNTER — Other Ambulatory Visit (INDEPENDENT_AMBULATORY_CARE_PROVIDER_SITE_OTHER): Payer: BC Managed Care – PPO

## 2023-04-22 DIAGNOSIS — R7309 Other abnormal glucose: Secondary | ICD-10-CM

## 2023-04-22 DIAGNOSIS — Z0000 Encounter for general adult medical examination without abnormal findings: Secondary | ICD-10-CM

## 2023-04-22 LAB — CBC WITH DIFFERENTIAL/PLATELET
Basophils Absolute: 0 10*3/uL (ref 0.0–0.1)
Basophils Relative: 0.5 % (ref 0.0–3.0)
Eosinophils Absolute: 0.2 10*3/uL (ref 0.0–0.7)
Eosinophils Relative: 3.9 % (ref 0.0–5.0)
HCT: 37.3 % (ref 36.0–46.0)
Hemoglobin: 12.6 g/dL (ref 12.0–15.0)
Lymphocytes Relative: 31.9 % (ref 12.0–46.0)
Lymphs Abs: 1.2 10*3/uL (ref 0.7–4.0)
MCHC: 33.8 g/dL (ref 30.0–36.0)
MCV: 94.9 fl (ref 78.0–100.0)
Monocytes Absolute: 0.3 10*3/uL (ref 0.1–1.0)
Monocytes Relative: 8.2 % (ref 3.0–12.0)
Neutro Abs: 2.2 10*3/uL (ref 1.4–7.7)
Neutrophils Relative %: 55.5 % (ref 43.0–77.0)
Platelets: 228 10*3/uL (ref 150.0–400.0)
RBC: 3.93 Mil/uL (ref 3.87–5.11)
RDW: 13.1 % (ref 11.5–15.5)
WBC: 3.9 10*3/uL — ABNORMAL LOW (ref 4.0–10.5)

## 2023-04-22 LAB — COMPREHENSIVE METABOLIC PANEL
ALT: 11 U/L (ref 0–35)
AST: 15 U/L (ref 0–37)
Albumin: 3.8 g/dL (ref 3.5–5.2)
Alkaline Phosphatase: 72 U/L (ref 39–117)
BUN: 19 mg/dL (ref 6–23)
CO2: 25 mEq/L (ref 19–32)
Calcium: 8.9 mg/dL (ref 8.4–10.5)
Chloride: 107 mEq/L (ref 96–112)
Creatinine, Ser: 0.73 mg/dL (ref 0.40–1.20)
GFR: 91.31 mL/min (ref >60.00)
Glucose, Bld: 96 mg/dL (ref 70–99)
Potassium: 4 mEq/L (ref 3.5–5.1)
Sodium: 140 mEq/L (ref 135–145)
Total Bilirubin: 0.4 mg/dL (ref 0.2–1.2)
Total Protein: 6.3 g/dL (ref 6.0–8.3)

## 2023-04-22 LAB — HEMOGLOBIN A1C: Hgb A1c MFr Bld: 6.2 % (ref 4.6–6.5)

## 2023-04-22 LAB — URINALYSIS
Bilirubin Urine: NEGATIVE
Hgb urine dipstick: NEGATIVE
Ketones, ur: NEGATIVE
Leukocytes,Ua: NEGATIVE
Nitrite: NEGATIVE
Specific Gravity, Urine: >=1.03 — AB (ref 1.000–1.030)
Total Protein, Urine: NEGATIVE
Urine Glucose: NEGATIVE
Urobilinogen, UA: 0.2 (ref 0.0–1.0)
pH: 6 (ref 5.0–8.0)

## 2023-04-22 LAB — LIPID PANEL
Cholesterol: 166 mg/dL (ref 0–200)
HDL: 68.6 mg/dL (ref >39.00)
LDL Cholesterol: 89 mg/dL (ref 0–99)
NonHDL: 97.6
Total CHOL/HDL Ratio: 2
Triglycerides: 41 mg/dL (ref 0.0–149.0)
VLDL: 8.2 mg/dL (ref 0.0–40.0)

## 2023-04-22 LAB — TSH: TSH: 0.81 u[IU]/mL (ref 0.35–5.50)

## 2023-04-24 ENCOUNTER — Ambulatory Visit: Payer: BC Managed Care – PPO | Admitting: Internal Medicine

## 2023-04-24 ENCOUNTER — Encounter: Payer: Self-pay | Admitting: Internal Medicine

## 2023-04-24 VITALS — BP 100/62 | HR 73 | Temp 97.6°F | Ht 64.0 in | Wt 130.0 lb

## 2023-04-24 DIAGNOSIS — F32A Depression, unspecified: Secondary | ICD-10-CM | POA: Diagnosis not present

## 2023-04-24 DIAGNOSIS — R232 Flushing: Secondary | ICD-10-CM | POA: Diagnosis not present

## 2023-04-24 DIAGNOSIS — Z0000 Encounter for general adult medical examination without abnormal findings: Secondary | ICD-10-CM

## 2023-04-24 DIAGNOSIS — K635 Polyp of colon: Secondary | ICD-10-CM | POA: Diagnosis not present

## 2023-04-24 DIAGNOSIS — Z23 Encounter for immunization: Secondary | ICD-10-CM | POA: Diagnosis not present

## 2023-04-24 DIAGNOSIS — Z780 Asymptomatic menopausal state: Secondary | ICD-10-CM

## 2023-04-24 NOTE — Progress Notes (Signed)
Subjective:  Patient ID: Jody Ford, female    DOB: 07-20-1966  Age: 57 y.o. MRN: [id number]  CC: Medical Management of Chronic Issues (6 month f/u)   HPI Jody Ford presents for anxiety, hot flashes. F/u on colon polyposis Pt lost wt on diet  Outpatient Medications Prior to Visit  Medication Sig Dispense Refill   ALPRAZolam (XANAX) 0.25 MG tablet Take 1 tablet (0.25 mg total) by mouth 2 (two) times daily as needed for anxiety. 60 tablet 2   aspirin 81 MG tablet Take 81 mg by mouth daily.     Multiple Vitamin (MULTIVITAMIN) tablet Take 1 tablet by mouth daily.     pentoxifylline (TRENTAL) 400 MG CR tablet Take 1 tablet (400 mg total) by mouth 3 (three) times daily. Take 1 by mouth 3 (three) times daily. 270 tablet 3   venlafaxine XR (EFFEXOR-XR) 37.5 MG 24 hr capsule Take 1 capsule (37.5 mg total) by mouth daily. 90 capsule 3   No facility-administered medications prior to visit.    ROS: Review of Systems  Constitutional:  Negative for activity change, appetite change, chills, fatigue and unexpected weight change.  HENT:  Negative for congestion, mouth sores and sinus pressure.   Eyes:  Negative for visual disturbance.  Respiratory:  Negative for cough and chest tightness.   Gastrointestinal:  Negative for abdominal pain and nausea.  Genitourinary:  Negative for difficulty urinating, frequency and vaginal pain.  Musculoskeletal:  Negative for back pain and gait problem.  Skin:  Negative for pallor and rash.  Neurological:  Negative for dizziness, tremors, weakness, numbness and headaches.  Psychiatric/Behavioral:  Negative for confusion and sleep disturbance.     Objective:  BP 100/62 (BP Location: Left Arm, Patient Position: Sitting, Cuff Size: Large)   Pulse 73   Temp 97.6 F (36.4 C) (Temporal)   Ht 5\' 4"  (1.626 m)   Wt 130 lb (59 kg)   SpO2 98%   BMI 22.31 kg/m   BP Readings from Last 3 Encounters:  04/24/23 100/62  10/25/22 122/78  07/25/22  120/78    Wt Readings from Last 3 Encounters:  04/24/23 130 lb (59 kg)  10/25/22 138 lb (62.6 kg)  07/25/22 153 lb (69.4 kg)    Physical Exam Constitutional:      General: She is not in acute distress.    Appearance: Normal appearance. She is well-developed.  HENT:     Head: Normocephalic.     Right Ear: External ear normal.     Left Ear: External ear normal.     Nose: Nose normal.  Eyes:     General:        Right eye: No discharge.        Left eye: No discharge.     Conjunctiva/sclera: Conjunctivae normal.     Pupils: Pupils are equal, round, and reactive to light.  Neck:     Thyroid: No thyromegaly.     Vascular: No JVD.     Trachea: No tracheal deviation.  Cardiovascular:     Rate and Rhythm: Normal rate and regular rhythm.     Heart sounds: Normal heart sounds.  Pulmonary:     Effort: No respiratory distress.     Breath sounds: No stridor. No wheezing.  Abdominal:     General: Bowel sounds are normal. There is no distension.     Palpations: Abdomen is soft. There is no mass.     Tenderness: There is no abdominal tenderness. There is no guarding  or rebound.  Musculoskeletal:        General: No tenderness.     Cervical back: Normal range of motion and neck supple. No rigidity.  Lymphadenopathy:     Cervical: No cervical adenopathy.  Skin:    Findings: No erythema or rash.  Neurological:     Cranial Nerves: No cranial nerve deficit.     Motor: No abnormal muscle tone.     Coordination: Coordination normal.     Deep Tendon Reflexes: Reflexes normal.  Psychiatric:        Behavior: Behavior normal.        Thought Content: Thought content normal.        Judgment: Judgment normal.     Lab Results  Component Value Date   WBC 3.9 (L) 04/22/2023   HGB 12.6 04/22/2023   HCT 37.3 04/22/2023   PLT 228.0 04/22/2023   GLUCOSE 96 04/22/2023   CHOL 166 04/22/2023   TRIG 41.0 04/22/2023   HDL 68.60 04/22/2023   LDLCALC 89 04/22/2023   ALT 11 04/22/2023   AST 15  04/22/2023   NA 140 04/22/2023   K 4.0 04/22/2023   CL 107 04/22/2023   CREATININE 0.73 04/22/2023   BUN 19 04/22/2023   CO2 25 04/22/2023   TSH 0.81 04/22/2023   INR 1.1 (H) 09/17/2013   HGBA1C 6.2 04/22/2023    MM 3D SCREEN BREAST BILATERAL  Result Date: 08/24/2022 CLINICAL DATA:  Screening. EXAM: DIGITAL SCREENING BILATERAL MAMMOGRAM WITH TOMOSYNTHESIS AND CAD TECHNIQUE: Bilateral screening digital craniocaudal and mediolateral oblique mammograms were obtained. Bilateral screening digital breast tomosynthesis was performed. The images were evaluated with computer-aided detection. COMPARISON:  Previous exam(s). ACR Breast Density Category c: The breast tissue is heterogeneously dense, which may obscure small masses. FINDINGS: There are no findings suspicious for malignancy. IMPRESSION: No mammographic evidence of malignancy. A result letter of this screening mammogram will be mailed directly to the patient. RECOMMENDATION: Screening mammogram in one year. (Code:SM-B-01Y) BI-RADS CATEGORY  1: Negative. Electronically Signed   By: Harmon Pier M.D.   On: 08/24/2022 07:55    Assessment & Plan:   Problem List Items Addressed This Visit     Depression    On Effexor x 2 years Will continue      Well adult exam   Relevant Orders   Comprehensive metabolic panel   TSH   T4, free   Vitamin B12   VITAMIN D 25 Hydroxy (Vit-D Deficiency, Fractures)   CBC with Differential/Platelet   Urinalysis   Lipid panel   Colon polyp    Will ref to GI - due colon in Nov 2024      Relevant Orders   Ambulatory referral to Gastroenterology   Hot flashes - Primary    On Effexor x 2 years      Relevant Orders   T4, free   Vitamin B12   Other Visit Diagnoses     Postmenopausal estrogen deficiency       Relevant Orders   DG Bone Density         No orders of the defined types were placed in this encounter.     Follow-up: Return in about 6 months (around 10/25/2023) for Wellness  Exam.  Sonda Primes, MD

## 2023-04-24 NOTE — Assessment & Plan Note (Signed)
On Effexor x 2 years

## 2023-04-24 NOTE — Addendum Note (Signed)
Addended by: Delsa Grana R on: 04/24/2023 08:27 AM   Modules accepted: Orders

## 2023-04-24 NOTE — Assessment & Plan Note (Signed)
On Effexor x 2 years Will continue

## 2023-04-24 NOTE — Assessment & Plan Note (Signed)
Will ref to GI - due colon in Nov 2024

## 2023-05-29 ENCOUNTER — Ambulatory Visit (INDEPENDENT_AMBULATORY_CARE_PROVIDER_SITE_OTHER)
Admission: RE | Admit: 2023-05-29 | Discharge: 2023-05-29 | Disposition: A | Discharge status: Home / Self Care | Payer: BC Managed Care – PPO | Source: Ambulatory Visit | Attending: Internal Medicine | Admitting: Internal Medicine

## 2023-05-29 DIAGNOSIS — Z780 Asymptomatic menopausal state: Secondary | ICD-10-CM

## 2023-05-30 DIAGNOSIS — Z780 Asymptomatic menopausal state: Secondary | ICD-10-CM | POA: Diagnosis not present

## 2023-07-11 ENCOUNTER — Other Ambulatory Visit: Payer: Self-pay | Admitting: Internal Medicine

## 2023-07-11 DIAGNOSIS — Z1231 Encounter for screening mammogram for malignant neoplasm of breast: Secondary | ICD-10-CM

## 2023-07-26 ENCOUNTER — Encounter: Payer: Self-pay | Admitting: Gastroenterology

## 2023-08-21 ENCOUNTER — Other Ambulatory Visit: Payer: Self-pay | Admitting: Internal Medicine

## 2023-08-27 ENCOUNTER — Ambulatory Visit
Admission: RE | Admit: 2023-08-27 | Discharge: 2023-08-27 | Disposition: A | Discharge status: Home / Self Care | Payer: BC Managed Care – PPO | Source: Ambulatory Visit | Attending: Internal Medicine | Admitting: Internal Medicine

## 2023-08-27 DIAGNOSIS — Z1231 Encounter for screening mammogram for malignant neoplasm of breast: Secondary | ICD-10-CM

## 2023-09-27 ENCOUNTER — Ambulatory Visit (AMBULATORY_SURGERY_CENTER): Payer: BC Managed Care – PPO

## 2023-09-27 VITALS — Ht 64.0 in | Wt 130.0 lb

## 2023-09-27 DIAGNOSIS — Z860100 Personal history of colon polyps, unspecified: Secondary | ICD-10-CM

## 2023-09-27 MED ORDER — NA SULFATE-K SULFATE-MG SULF 17.5-3.13-1.6 GM/177ML PO SOLN
1.0000 | Freq: Once | ORAL | 0 refills | Status: AC
Start: 2023-09-27 — End: 2023-09-27

## 2023-09-27 NOTE — Progress Notes (Signed)

## 2023-10-14 ENCOUNTER — Encounter: Payer: Self-pay | Admitting: Gastroenterology

## 2023-10-18 ENCOUNTER — Encounter: Payer: Self-pay | Admitting: Gastroenterology

## 2023-10-19 ENCOUNTER — Encounter: Payer: Self-pay | Admitting: Certified Registered Nurse Anesthetist

## 2023-10-22 ENCOUNTER — Encounter: Payer: Self-pay | Admitting: Gastroenterology

## 2023-10-22 ENCOUNTER — Ambulatory Visit: Payer: BC Managed Care – PPO | Admitting: Gastroenterology

## 2023-10-22 VITALS — BP 101/67 | HR 63 | Temp 97.4°F | Resp 10 | Ht 64.0 in | Wt 130.0 lb

## 2023-10-22 DIAGNOSIS — D122 Benign neoplasm of ascending colon: Secondary | ICD-10-CM

## 2023-10-22 DIAGNOSIS — Z09 Encounter for follow-up examination after completed treatment for conditions other than malignant neoplasm: Secondary | ICD-10-CM

## 2023-10-22 DIAGNOSIS — Z860100 Personal history of colon polyps, unspecified: Secondary | ICD-10-CM

## 2023-10-22 MED ORDER — SODIUM CHLORIDE 0.9 % IV SOLN: 500.0000 mL | Freq: Once | INTRAVENOUS | Status: DC

## 2023-10-22 NOTE — Progress Notes (Unsigned)
Called to room to assist during endoscopic procedure.  Patient ID and intended procedure confirmed with present staff. Received instructions for my participation in the procedure from the performing physician.  

## 2023-10-22 NOTE — Progress Notes (Unsigned)
Report given to PACU, vss 

## 2023-10-22 NOTE — Patient Instructions (Signed)
Please read handouts provided. Continue present medications. Await pathology results. Repeat colonoscopy 10/23/2023.  YOU HAD AN ENDOSCOPIC PROCEDURE TODAY AT THE [facility]:   Refer to the procedure report that was given to you for any specific questions about what was found during the examination.  If the procedure report does not answer your questions, please call your gastroenterologist to clarify.  If you requested that your care partner not be given the details of your procedure findings, then the procedure report has been included in a sealed envelope for you to review at your convenience later.  YOU SHOULD EXPECT: Some feelings of bloating in the abdomen. Passage of more gas than usual.  Walking can help get rid of the air that was put into your GI tract during the procedure and reduce the bloating. If you had a lower endoscopy (such as a colonoscopy or flexible sigmoidoscopy) you may notice spotting of blood in your stool or on the toilet paper. If you underwent a bowel prep for your procedure, you may not have a normal bowel movement for a few days.  Please Note:  You might notice some irritation and congestion in your nose or some drainage.  This is from the oxygen used during your procedure.  There is no need for concern and it should clear up in a day or so.  SYMPTOMS TO REPORT IMMEDIATELY:  Following lower endoscopy (colonoscopy or flexible sigmoidoscopy):  Excessive amounts of blood in the stool  Significant tenderness or worsening of abdominal pains  Swelling of the abdomen that is new, acute  Fever of 100F or higher   For urgent or emergent issues, a gastroenterologist can be reached at any hour by calling [phone]. Do not use MyChart messaging for urgent concerns.    DIET:  Drink plenty of fluids but you should avoid alcoholic beverages for 24 hours.  ACTIVITY:  You should plan to take it easy for the rest of today and you should NOT DRIVE or use heavy  machinery until tomorrow (because of the sedation medicines used during the test).    FOLLOW UP: Our staff will call the number listed on your records the next business day following your procedure.  We will call around 7:15- 8:00 am to check on you and address any questions or concerns that you may have regarding the information given to you following your procedure. If we do not reach you, we will leave a message.     If any biopsies were taken you will be contacted by phone or by letter within the next 1-3 weeks.  Please call us at [phone] if you have not heard about the biopsies in 3 weeks.    SIGNATURES/CONFIDENTIALITY: You and/or your care partner have signed paperwork which will be entered into your electronic medical record.  These signatures attest to the fact that that the information above on your After Visit Summary has been reviewed and is understood.  Full responsibility of the confidentiality of this discharge information lies with you and/or your care-partner.

## 2023-10-22 NOTE — Op Note (Signed)
[facility] Patient Name: Jody Ford Procedure Date: 10/22/2023 9:39 AM MRN: [id number] Endoscopist: Napoleon Form , MD, [id number] Age: 57 Referring MD:  Date of Birth: Jul 14, 1966 Gender: Female Account #: [id number] Procedure:                Colonoscopy Indications:              High risk colon cancer surveillance: Personal                            history of colonic polyps Medicines:                Propofol per Anesthesia Procedure:                Pre-Anesthesia Assessment:                           - Prior to the procedure, a History and Physical                            was performed, and patient medications and                            allergies were reviewed. The patient's tolerance of                            previous anesthesia was also reviewed. The risks                            and benefits of the procedure and the sedation                            options and risks were discussed with the patient.                            All questions were answered, and informed consent                            was obtained. Prior Anticoagulants: The patient has                            taken no anticoagulant or antiplatelet agents. ASA                            Grade Assessment: II - A patient with mild systemic                            disease. After reviewing the risks and benefits,                            the patient was deemed in satisfactory condition to                            undergo the procedure.  After obtaining informed consent, the colonoscope                            was passed under direct vision. Throughout the                            procedure, the patient's blood pressure, pulse, and                            oxygen saturations were monitored continuously. The                            [id number]-[id number] Colonoscope [id number] was introduced                            through the anus and advanced to  the the cecum,                            identified by appendiceal orifice and ileocecal                            valve. The colonoscopy was performed without                            difficulty. The patient tolerated the procedure                            well. The quality of the bowel preparation was                            inadequate. The ileocecal valve, appendiceal                            orifice, and rectum were photographed. Scope In: 9:49:11 AM Scope Out: 10:12:37 AM Scope Withdrawal Time: 0 hours 18 minutes 34 seconds  Total Procedure Duration: 0 hours 23 minutes 26 seconds  Findings:                 The perianal and digital rectal examinations were                            normal.                           A 12 mm polyp was found in the ascending colon. The                            polyp was sessile. The polyp was removed with a                            cold snare. Resection and retrieval were complete.                           A few small-mouthed diverticula were found in the  sigmoid colon.                           Non-bleeding external and internal hemorrhoids were                            found during retroflexion. The hemorrhoids were                            small. Complications:            No immediate complications. Estimated Blood Loss:     Estimated blood loss was minimal. Impression:               - Preparation of the colon was inadequate.                           - One 12 mm polyp in the ascending colon, removed                            with a cold snare. Resected and retrieved.                           - Diverticulosis in the sigmoid colon.                           - Non-bleeding external and internal hemorrhoids. Recommendation:           - Patient has a contact number available for                            emergencies. The signs and symptoms of potential                            delayed complications  were discussed with the                            patient. Return to normal activities tomorrow.                            Written discharge instructions were provided to the                            patient.                           - Resume previous diet.                           - Continue present medications.                           - Await pathology results.                           - Repeat colonoscopy at the next available  appointment because the bowel preparation was                            suboptimal. Napoleon Form, MD 10/22/2023 10:21:26 AM This report has been signed electronically.

## 2023-10-22 NOTE — Progress Notes (Unsigned)
[facility] History and Physical   Primary Care Physician:  Plotnikov, Georgina Quint, MD   Reason for Procedure:  History of adenomatous colon polyps  Plan:    Surveillance colonoscopy with possible interventions as needed     HPI: Jody Ford is a very pleasant 57 y.o. female here for surveillance colonoscopy. Denies any nausea, vomiting, abdominal pain, melena or bright red blood per rectum  The risks and benefits as well as alternatives of endoscopic procedure(s) have been discussed and reviewed. All questions answered. The patient agrees to proceed.    Past Medical History:  Diagnosis Date   Allergy    Cancer (HCC)    Skin cancer squamous cell   Clotting disorder (HCC)    Factor V Leiden mutation (HCC)    History of, hyper coagulation stable   Personal history of DVT (deep vein thrombosis)     Past Surgical History:  Procedure Laterality Date   BREAST CYST ASPIRATION     pt states 15 year ago. pt believes it is the rt breast but is not sure and also is unsure where it was performed.   none      Prior to Admission medications   Medication Sig Start Date End Date Taking? Authorizing Provider  aspirin 81 MG tablet Take 81 mg by mouth daily.   Yes [provider]  Calcium-Magnesium-Vitamin D (CITRACAL CALCIUM +D3) 600-40-500 MG-MG-UNIT TB24  05/27/23  Yes [provider]  Multiple Vitamin (MULTIVITAMIN) tablet Take 1 tablet by mouth daily.   Yes [provider]  pentoxifylline (TRENTAL) 400 MG CR tablet TAKE 1 TABLET (400 MG TOTAL) BY MOUTH 3 (THREE) TIMES DAILY. TAKE 1 BY MOUTH 3 (THREE) TIMES DAILY. 08/21/23  Yes Plotnikov, Georgina Quint, MD  venlafaxine XR (EFFEXOR-XR) 37.5 MG 24 hr capsule Take 1 capsule (37.5 mg total) by mouth daily. 07/25/22  Yes Plotnikov, Georgina Quint, MD  ALPRAZolam (XANAX) 0.25 MG tablet Take 1 tablet (0.25 mg total) by mouth 2 (two) times daily as needed for anxiety. 05/24/22   Plotnikov, Georgina Quint, MD     Current Outpatient Medications  Medication Sig Dispense Refill   aspirin 81 MG tablet Take 81 mg by mouth daily.     Calcium-Magnesium-Vitamin D (CITRACAL CALCIUM +D3) 600-40-500 MG-MG-UNIT TB24      Multiple Vitamin (MULTIVITAMIN) tablet Take 1 tablet by mouth daily.     pentoxifylline (TRENTAL) 400 MG CR tablet TAKE 1 TABLET (400 MG TOTAL) BY MOUTH 3 (THREE) TIMES DAILY. TAKE 1 BY MOUTH 3 (THREE) TIMES DAILY. 270 tablet 3   venlafaxine XR (EFFEXOR-XR) 37.5 MG 24 hr capsule Take 1 capsule (37.5 mg total) by mouth daily. 90 capsule 3   ALPRAZolam (XANAX) 0.25 MG tablet Take 1 tablet (0.25 mg total) by mouth 2 (two) times daily as needed for anxiety. 60 tablet 2   Current Facility-Administered Medications  Medication Dose Route Frequency Provider Last Rate Last Admin   0.9 %  sodium chloride infusion  500 mL Intravenous Once Napoleon Form, MD        Allergies as of 10/22/2023   (No Known Allergies)    Family History  Problem Relation Age of Onset   Cancer Mother        Leukemia   Diabetes Mother    Stroke Mother    Colon polyps Father    Heart disease Father    Cancer Sister        Breast   Breast cancer Sister    Colon  cancer Neg Hx    Esophageal cancer Neg Hx    Rectal cancer Neg Hx    Stomach cancer Neg Hx     Social History   Socioeconomic History   Marital status: Married    Spouse name: Not on file   Number of children: Not on file   Years of education: Not on file   Highest education level: Bachelor's degree (e.g., BA, AB, BS)  Occupational History   Not on file  Tobacco Use   Smoking status: Never   Smokeless tobacco: Never   Tobacco comments:    Regular Exercise - No. 4 cats 1 dogs no children  Vaping Use   Vaping status: Never Used  Substance and Sexual Activity   Alcohol use: Yes    Comment: Occasional glass of wine.    Drug use: Never   Sexual activity: Not on file  Other Topics Concern   Not on file  Social History Narrative   Not on  file   Social Determinants of Health   Financial Resource Strain: Low Risk  (04/20/2023)   Overall Financial Resource Strain (CARDIA)    Difficulty of Paying Living Expenses: Not hard at all  Food Insecurity: No Food Insecurity (04/20/2023)   Hunger Vital Sign    Worried About Running Out of Food in the Last Year: Never true    Ran Out of Food in the Last Year: Never true  Transportation Needs: No Transportation Needs (04/20/2023)   PRAPARE - Administrator, Civil Service (Medical): No    Lack of Transportation (Non-Medical): No  Physical Activity: Insufficiently Active (04/20/2023)   Exercise Vital Sign    Days of Exercise per Week: 1 day    Minutes of Exercise per Session: 40 min  Stress: No Stress Concern Present (04/20/2023)   [employer] of Occupational Health - Occupational Stress Questionnaire    Feeling of Stress : Only a little  Social Connections: Moderately Isolated (04/20/2023)   Social Connection and Isolation Panel [NHANES]    Frequency of Communication with Friends and Family: Once a week    Frequency of Social Gatherings with Friends and Family: Once a week    Attends Religious Services: 1 to 4 times per year    Active Member of [employer] or Organizations: No    Attends Engineer, structural: Not on file    Marital Status: Married  Catering manager Violence: Not on file    Review of Systems:  All other review of systems negative except as mentioned in the HPI.  Physical Exam: Vital signs in last 24 hours: BP 122/74   Pulse (!) 56   Temp (!) 97.4 F (36.3 C) (Temporal)   Resp 10   Ht 5\' 4"  (1.626 m)   Wt 130 lb (59 kg)   SpO2 100%   BMI 22.31 kg/m  General:   Alert, NAD Lungs:  Clear .   Heart:  Regular rate and rhythm Abdomen:  Soft, nontender and nondistended. Neuro/Psych:  Alert and cooperative. Normal mood and affect. A and O x 3  Reviewed labs, radiology imaging, old records and pertinent past GI work up  Patient is  appropriate for planned procedure(s) and anesthesia in an ambulatory setting   K. Scherry Ran , MD [phone]

## 2023-10-22 NOTE — Progress Notes (Unsigned)
Pt's states no medical or surgical changes since previsit or office visit. 

## 2023-10-23 ENCOUNTER — Telehealth: Payer: Self-pay

## 2023-10-23 ENCOUNTER — Ambulatory Visit: Payer: BC Managed Care – PPO | Admitting: Internal Medicine

## 2023-10-23 ENCOUNTER — Encounter: Payer: Self-pay | Admitting: Internal Medicine

## 2023-10-23 ENCOUNTER — Encounter: Payer: Self-pay | Admitting: Gastroenterology

## 2023-10-23 VITALS — BP 111/72 | HR 66 | Temp 97.7°F | Resp 12 | Ht 64.0 in | Wt 130.0 lb

## 2023-10-23 DIAGNOSIS — D120 Benign neoplasm of cecum: Secondary | ICD-10-CM

## 2023-10-23 DIAGNOSIS — Z860100 Personal history of colon polyps, unspecified: Secondary | ICD-10-CM | POA: Diagnosis not present

## 2023-10-23 DIAGNOSIS — Z09 Encounter for follow-up examination after completed treatment for conditions other than malignant neoplasm: Secondary | ICD-10-CM

## 2023-10-23 MED ORDER — SODIUM CHLORIDE 0.9 % IV SOLN: 500.0000 mL | Freq: Once | INTRAVENOUS | Status: DC

## 2023-10-23 NOTE — Op Note (Signed)
[facility] Patient Name: Jody Ford Procedure Date: 10/23/2023 8:50 AM MRN: [id number] Endoscopist: Madelyn Brunner [place] , , [id number] Age: 57 Referring MD:  Date of Birth: 11-03-1966 Gender: Female Account #: [id number] Procedure:                Colonoscopy Indications:              High risk colon cancer surveillance: Personal                            history of colonic polyps Medicines:                Monitored Anesthesia Care Procedure:                Pre-Anesthesia Assessment:                           - Prior to the procedure, a History and Physical                            was performed, and patient medications and                            allergies were reviewed. The patient's tolerance of                            previous anesthesia was also reviewed. The risks                            and benefits of the procedure and the sedation                            options and risks were discussed with the patient.                            All questions were answered, and informed consent                            was obtained. Prior Anticoagulants: The patient has                            taken no anticoagulant or antiplatelet agents. ASA                            Grade Assessment: II - A patient with mild systemic                            disease. After reviewing the risks and benefits,                            the patient was deemed in satisfactory condition to                            undergo the procedure.  After obtaining informed consent, the colonoscope                            was passed under direct vision. Throughout the                            procedure, the patient's blood pressure, pulse, and                            oxygen saturations were monitored continuously. The                            Olympus Scope SN: [id number] was introduced through                            the anus and advanced to the  the terminal ileum.                            The colonoscopy was performed without difficulty.                            The patient tolerated the procedure well. The                            quality of the bowel preparation was excellent. The                            terminal ileum, ileocecal valve, appendiceal                            orifice, and rectum were photographed. Scope In: 8:55:18 AM Scope Out: 9:14:38 AM Scope Withdrawal Time: 0 hours 13 minutes 23 seconds  Total Procedure Duration: 0 hours 19 minutes 20 seconds  Findings:                 The terminal ileum appeared normal.                           Two sessile polyps were found in the cecum. The                            polyps were 4 to 6 mm in size. These polyps were                            removed with a cold snare. Resection and retrieval                            were complete.                           A few diverticula were found in the sigmoid colon                            and ascending colon.  Non-bleeding internal hemorrhoids were found during                            retroflexion. Complications:            No immediate complications. Estimated Blood Loss:     Estimated blood loss was minimal. Impression:               - The examined portion of the ileum was normal.                           - Two 4 to 6 mm polyps in the cecum, removed with a                            cold snare. Resected and retrieved.                           - Diverticulosis in the sigmoid colon and in the                            ascending colon.                           - Non-bleeding internal hemorrhoids. Recommendation:           - Discharge patient to home (with escort).                           - Await pathology results.                           - The findings and recommendations were discussed                            with the patient. Dr Particia Lather "Alan Ripper" Leonides Schanz,   10/23/2023 9:18:54 AM

## 2023-10-23 NOTE — Patient Instructions (Signed)

## 2023-10-23 NOTE — Progress Notes (Signed)
GASTROENTEROLOGY PROCEDURE H&P NOTE   Primary Care Physician: Tresa Garter, MD    Reason for Procedure:   History of colon polyps  Plan:    Colonoscopy  Patient is appropriate for endoscopic procedure(s) in the ambulatory ([facility]) setting.  The nature of the procedure, as well as the risks, benefits, and alternatives were carefully and thoroughly reviewed with the patient. Ample time for discussion and questions allowed. The patient understood, was satisfied, and agreed to proceed.     HPI: Jody Ford is a 57 y.o. female who presents for colonoscopy for history of colon polyps. She had an inadequate prep during her colonoscopy yesterday.  Past Medical History:  Diagnosis Date   Allergy    Cancer (HCC)    Skin cancer squamous cell   Clotting disorder (HCC)    Factor V Leiden mutation (HCC)    History of, hyper coagulation stable   Personal history of DVT (deep vein thrombosis)     Past Surgical History:  Procedure Laterality Date   BREAST CYST ASPIRATION     pt states 15 year ago. pt believes it is the rt breast but is not sure and also is unsure where it was performed.   COLONOSCOPY     none      Prior to Admission medications   Medication Sig Start Date End Date Taking? Authorizing Provider  aspirin 81 MG tablet Take 81 mg by mouth daily.   Yes [provider]  Calcium-Magnesium-Vitamin D (CITRACAL CALCIUM +D3) 600-40-500 MG-MG-UNIT TB24  05/27/23  Yes [provider]  Multiple Vitamin (MULTIVITAMIN) tablet Take 1 tablet by mouth daily.   Yes [provider]  pentoxifylline (TRENTAL) 400 MG CR tablet TAKE 1 TABLET (400 MG TOTAL) BY MOUTH 3 (THREE) TIMES DAILY. TAKE 1 BY MOUTH 3 (THREE) TIMES DAILY. 08/21/23  Yes Plotnikov, Georgina Quint, MD  venlafaxine XR (EFFEXOR-XR) 37.5 MG 24 hr capsule Take 1 capsule (37.5 mg total) by mouth daily. 07/25/22  Yes Plotnikov, Georgina Quint, MD  ALPRAZolam (XANAX) 0.25 MG tablet Take 1 tablet (0.25 mg  total) by mouth 2 (two) times daily as needed for anxiety. 05/24/22   Plotnikov, Georgina Quint, MD    Current Outpatient Medications  Medication Sig Dispense Refill   aspirin 81 MG tablet Take 81 mg by mouth daily.     Calcium-Magnesium-Vitamin D (CITRACAL CALCIUM +D3) 600-40-500 MG-MG-UNIT TB24      Multiple Vitamin (MULTIVITAMIN) tablet Take 1 tablet by mouth daily.     pentoxifylline (TRENTAL) 400 MG CR tablet TAKE 1 TABLET (400 MG TOTAL) BY MOUTH 3 (THREE) TIMES DAILY. TAKE 1 BY MOUTH 3 (THREE) TIMES DAILY. 270 tablet 3   venlafaxine XR (EFFEXOR-XR) 37.5 MG 24 hr capsule Take 1 capsule (37.5 mg total) by mouth daily. 90 capsule 3   ALPRAZolam (XANAX) 0.25 MG tablet Take 1 tablet (0.25 mg total) by mouth 2 (two) times daily as needed for anxiety. 60 tablet 2   Current Facility-Administered Medications  Medication Dose Route Frequency Provider Last Rate Last Admin   0.9 %  sodium chloride infusion  500 mL Intravenous Once Imogene Burn, MD        Allergies as of 10/23/2023   (No Known Allergies)    Family History  Problem Relation Age of Onset   Cancer Mother        Leukemia   Diabetes Mother    Stroke Mother    Colon polyps Father    Heart disease Father  Cancer Sister        Breast   Breast cancer Sister    Colon cancer Neg Hx    Esophageal cancer Neg Hx    Rectal cancer Neg Hx    Stomach cancer Neg Hx     Social History   Socioeconomic History   Marital status: Married    Spouse name: Not on file   Number of children: Not on file   Years of education: Not on file   Highest education level: Bachelor's degree (e.g., BA, AB, BS)  Occupational History   Not on file  Tobacco Use   Smoking status: Never   Smokeless tobacco: Never   Tobacco comments:    Regular Exercise - No. 4 cats 1 dogs no children  Vaping Use   Vaping status: Never Used  Substance and Sexual Activity   Alcohol use: Yes    Comment: Occasional glass of wine.    Drug use: Never   Sexual  activity: Not on file  Other Topics Concern   Not on file  Social History Narrative   Not on file   Social Determinants of Health   Financial Resource Strain: Low Risk  (04/20/2023)   Overall Financial Resource Strain (CARDIA)    Difficulty of Paying Living Expenses: Not hard at all  Food Insecurity: No Food Insecurity (04/20/2023)   Hunger Vital Sign    Worried About Running Out of Food in the Last Year: Never true    Ran Out of Food in the Last Year: Never true  Transportation Needs: No Transportation Needs (04/20/2023)   PRAPARE - Administrator, Civil Service (Medical): No    Lack of Transportation (Non-Medical): No  Physical Activity: Insufficiently Active (04/20/2023)   Exercise Vital Sign    Days of Exercise per Week: 1 day    Minutes of Exercise per Session: 40 min  Stress: No Stress Concern Present (04/20/2023)   [employer] of Occupational Health - Occupational Stress Questionnaire    Feeling of Stress : Only a little  Social Connections: Moderately Isolated (04/20/2023)   Social Connection and Isolation Panel [NHANES]    Frequency of Communication with Friends and Family: Once a week    Frequency of Social Gatherings with Friends and Family: Once a week    Attends Religious Services: 1 to 4 times per year    Active Member of [employer] or Organizations: No    Attends Engineer, structural: Not on file    Marital Status: Married  Catering manager Violence: Not on file    Physical Exam: Vital signs in last 24 hours: BP (!) 165/70   Pulse 77   Temp 97.7 F (36.5 C) (Skin)   Ht 5\' 4"  (1.626 m)   Wt 130 lb (59 kg)   SpO2 99%   BMI 22.31 kg/m  GEN: NAD EYE: Sclerae anicteric ENT: MMM CV: Non-tachycardic Pulm: No increased work of breathing GI: Soft, NT/ND NEURO:  Alert & Oriented   Eulah Pont, MD [facility]  10/23/2023 8:49 AM

## 2023-10-23 NOTE — Telephone Encounter (Signed)
Poor prep yesterday.  Pt returning today for colonoscopy.

## 2023-10-23 NOTE — Progress Notes (Signed)
Called to room to assist during endoscopic procedure.  Patient ID and intended procedure confirmed with present staff. Received instructions for my participation in the procedure from the performing physician.  

## 2023-10-23 NOTE — Progress Notes (Signed)
Pt's states no medical or surgical changes since previsit or office visit. 

## 2023-10-23 NOTE — Progress Notes (Signed)
Report given to PACU, vss 

## 2023-10-24 ENCOUNTER — Telehealth: Payer: Self-pay | Admitting: *Deleted

## 2023-10-24 LAB — SURGICAL PATHOLOGY

## 2023-10-24 NOTE — Telephone Encounter (Signed)
  Follow up Call-     10/23/2023    7:39 AM 10/22/2023    9:08 AM  Call back number  Post procedure Call Back phone  # [phone]  Permission to leave phone message Yes Yes     Patient questions:  Do you have a fever, pain , or abdominal swelling? No. Pain Score  0 *  Have you tolerated food without any problems? Yes.    Have you been able to return to your normal activities? Yes.    Do you have any questions about your discharge instructions: Diet   No. Medications  No. Follow up visit  No.  Do you have questions or concerns about your Care? Yes.    Actions: * If pain score is 4 or above: No action needed, pain <4.

## 2023-10-25 ENCOUNTER — Encounter: Payer: Self-pay | Admitting: Internal Medicine

## 2023-10-25 LAB — SURGICAL PATHOLOGY

## 2023-10-28 ENCOUNTER — Other Ambulatory Visit (INDEPENDENT_AMBULATORY_CARE_PROVIDER_SITE_OTHER): Payer: BC Managed Care – PPO

## 2023-10-28 DIAGNOSIS — Z0000 Encounter for general adult medical examination without abnormal findings: Secondary | ICD-10-CM | POA: Diagnosis not present

## 2023-10-28 DIAGNOSIS — R232 Flushing: Secondary | ICD-10-CM

## 2023-10-28 LAB — COMPREHENSIVE METABOLIC PANEL
ALT: 12 U/L (ref 0–35)
AST: 16 U/L (ref 0–37)
Albumin: 4.1 g/dL (ref 3.5–5.2)
Alkaline Phosphatase: 62 U/L (ref 39–117)
BUN: 20 mg/dL (ref 6–23)
CO2: 28 meq/L (ref 19–32)
Calcium: 9 mg/dL (ref 8.4–10.5)
Chloride: 105 meq/L (ref 96–112)
Creatinine, Ser: 0.75 mg/dL (ref 0.40–1.20)
GFR: 88.07 mL/min (ref >60.00)
Glucose, Bld: 98 mg/dL (ref 70–99)
Potassium: 4 meq/L (ref 3.5–5.1)
Sodium: 140 meq/L (ref 135–145)
Total Bilirubin: 0.4 mg/dL (ref 0.2–1.2)
Total Protein: 6.4 g/dL (ref 6.0–8.3)

## 2023-10-28 LAB — URINALYSIS
Bilirubin Urine: NEGATIVE
Hgb urine dipstick: NEGATIVE
Ketones, ur: NEGATIVE
Leukocytes,Ua: NEGATIVE
Nitrite: NEGATIVE
Specific Gravity, Urine: 1.025 (ref 1.000–1.030)
Total Protein, Urine: NEGATIVE
Urine Glucose: NEGATIVE
Urobilinogen, UA: 0.2 (ref 0.0–1.0)
pH: 6 (ref 5.0–8.0)

## 2023-10-28 LAB — LIPID PANEL
Cholesterol: 171 mg/dL (ref 0–200)
HDL: 72.3 mg/dL (ref >39.00)
LDL Cholesterol: 90 mg/dL (ref 0–99)
NonHDL: 99.1
Total CHOL/HDL Ratio: 2
Triglycerides: 46 mg/dL (ref 0.0–149.0)
VLDL: 9.2 mg/dL (ref 0.0–40.0)

## 2023-10-28 LAB — CBC WITH DIFFERENTIAL/PLATELET
Basophils Absolute: 0 10*3/uL (ref 0.0–0.1)
Basophils Relative: 0.5 % (ref 0.0–3.0)
Eosinophils Absolute: 0.2 10*3/uL (ref 0.0–0.7)
Eosinophils Relative: 3.9 % (ref 0.0–5.0)
HCT: 38.1 % (ref 36.0–46.0)
Hemoglobin: 12.6 g/dL (ref 12.0–15.0)
Lymphocytes Relative: 24.5 % (ref 12.0–46.0)
Lymphs Abs: 1.1 10*3/uL (ref 0.7–4.0)
MCHC: 33 g/dL (ref 30.0–36.0)
MCV: 95.8 fL (ref 78.0–100.0)
Monocytes Absolute: 0.4 10*3/uL (ref 0.1–1.0)
Monocytes Relative: 8.8 % (ref 3.0–12.0)
Neutro Abs: 2.8 10*3/uL (ref 1.4–7.7)
Neutrophils Relative %: 62.3 % (ref 43.0–77.0)
Platelets: 262 10*3/uL (ref 150.0–400.0)
RBC: 3.98 Mil/uL (ref 3.87–5.11)
RDW: 13 % (ref 11.5–15.5)
WBC: 4.5 10*3/uL (ref 4.0–10.5)

## 2023-10-28 LAB — VITAMIN B12: Vitamin B-12: 426 pg/mL (ref 211–911)

## 2023-10-28 LAB — VITAMIN D 25 HYDROXY (VIT D DEFICIENCY, FRACTURES): VITD: 46.74 ng/mL (ref 30.00–100.00)

## 2023-10-28 LAB — T4, FREE: Free T4: 0.84 ng/dL (ref 0.60–1.60)

## 2023-10-28 LAB — TSH: TSH: 0.75 u[IU]/mL (ref 0.35–5.50)

## 2023-11-04 ENCOUNTER — Encounter: Payer: Self-pay | Admitting: Internal Medicine

## 2023-11-04 ENCOUNTER — Ambulatory Visit (INDEPENDENT_AMBULATORY_CARE_PROVIDER_SITE_OTHER): Payer: BC Managed Care – PPO | Admitting: Internal Medicine

## 2023-11-04 VITALS — BP 112/68 | HR 68 | Temp 98.2°F | Ht 64.0 in | Wt 130.0 lb

## 2023-11-04 DIAGNOSIS — Z23 Encounter for immunization: Secondary | ICD-10-CM

## 2023-11-04 DIAGNOSIS — Z0000 Encounter for general adult medical examination without abnormal findings: Secondary | ICD-10-CM

## 2023-11-04 DIAGNOSIS — I872 Venous insufficiency (chronic) (peripheral): Secondary | ICD-10-CM | POA: Diagnosis not present

## 2023-11-04 DIAGNOSIS — K635 Polyp of colon: Secondary | ICD-10-CM

## 2023-11-04 NOTE — Assessment & Plan Note (Addendum)
We discussed age appropriate health related issues, including available/recomended screening tests and vaccinations. We discussed a need for adhering to healthy diet and exercise. Labs were ordered to be later reviewed . All questions were answered. PAP per GYN Colonoscopy - 2019, 2024 due 2027 (polyps) Shingrix discussed No fam h/o CAD Mammo q 12 mo Eye appt, Derm q 12 mo

## 2023-11-04 NOTE — Progress Notes (Signed)
Subjective:  Patient ID: Jody Ford, female    DOB: [birth year]  Age: 57 y.o. MRN: [id number]  CC: Annual Exam   HPI Jody Ford presents for a well exam  Outpatient Medications Prior to Visit  Medication Sig Dispense Refill   ALPRAZolam (XANAX) 0.25 MG tablet Take 1 tablet (0.25 mg total) by mouth 2 (two) times daily as needed for anxiety. 60 tablet 2   aspirin 81 MG tablet Take 81 mg by mouth daily.     Calcium-Magnesium-Vitamin D (CITRACAL CALCIUM +D3) 600-40-500 MG-MG-UNIT TB24      Multiple Vitamin (MULTIVITAMIN) tablet Take 1 tablet by mouth daily.     Na Sulfate-K Sulfate-Mg Sulf 17.5-3.13-1.6 GM/177ML SOLN SMARTSIG:1 Kit(s) By Mouth Once     pentoxifylline (TRENTAL) 400 MG CR tablet TAKE 1 TABLET (400 MG TOTAL) BY MOUTH 3 (THREE) TIMES DAILY. TAKE 1 BY MOUTH 3 (THREE) TIMES DAILY. 270 tablet 3   venlafaxine XR (EFFEXOR-XR) 37.5 MG 24 hr capsule Take 1 capsule (37.5 mg total) by mouth daily. 90 capsule 3   No facility-administered medications prior to visit.    ROS: Review of Systems  Constitutional:  Negative for activity change, appetite change, chills, fatigue and unexpected weight change.  HENT:  Negative for congestion, mouth sores and sinus pressure.   Eyes:  Negative for visual disturbance.  Respiratory:  Negative for cough and chest tightness.   Gastrointestinal:  Negative for abdominal pain and nausea.  Genitourinary:  Negative for difficulty urinating, frequency and vaginal pain.  Musculoskeletal:  Negative for back pain and gait problem.  Skin:  Negative for pallor and rash.  Neurological:  Negative for dizziness, tremors, weakness, numbness and headaches.  Psychiatric/Behavioral:  Negative for confusion and sleep disturbance.     Objective:  BP 112/68 (BP Location: Right Arm, Patient Position: Sitting, Cuff Size: Normal)   Pulse 68   Temp 98.2 F (36.8 C) (Oral)   Ht 5\' 4"  (1.626 m)   Wt 130 lb (59 kg)   SpO2 97%   BMI 22.31 kg/m   BP  Readings from Last 3 Encounters:  11/04/23 112/68  10/23/23 111/72  10/22/23 101/67    Wt Readings from Last 3 Encounters:  11/04/23 130 lb (59 kg)  10/23/23 130 lb (59 kg)  10/22/23 130 lb (59 kg)    Physical Exam Constitutional:      General: She is not in acute distress.    Appearance: She is well-developed.  HENT:     Head: Normocephalic.     Right Ear: External ear normal.     Left Ear: External ear normal.     Nose: Nose normal.  Eyes:     General:        Right eye: No discharge.        Left eye: No discharge.     Conjunctiva/sclera: Conjunctivae normal.     Pupils: Pupils are equal, round, and reactive to light.  Neck:     Thyroid: No thyromegaly.     Vascular: No JVD.     Trachea: No tracheal deviation.  Cardiovascular:     Rate and Rhythm: Normal rate and regular rhythm.     Heart sounds: Normal heart sounds.  Pulmonary:     Effort: No respiratory distress.     Breath sounds: No stridor. No wheezing.  Abdominal:     General: Bowel sounds are normal. There is no distension.     Palpations: Abdomen is soft. There is no mass.  Tenderness: There is no abdominal tenderness. There is no guarding or rebound.  Musculoskeletal:        General: No tenderness.     Cervical back: Normal range of motion and neck supple. No rigidity.  Lymphadenopathy:     Cervical: No cervical adenopathy.  Skin:    Findings: No erythema or rash.  Neurological:     Cranial Nerves: No cranial nerve deficit.     Motor: No abnormal muscle tone.     Coordination: Coordination normal.     Deep Tendon Reflexes: Reflexes normal.  Psychiatric:        Behavior: Behavior normal.        Thought Content: Thought content normal.        Judgment: Judgment normal.     Lab Results  Component Value Date   WBC 4.5 10/28/2023   HGB 12.6 10/28/2023   HCT 38.1 10/28/2023   PLT 262.0 10/28/2023   GLUCOSE 98 10/28/2023   CHOL 171 10/28/2023   TRIG 46.0 10/28/2023   HDL 72.30 10/28/2023    LDLCALC 90 10/28/2023   ALT 12 10/28/2023   AST 16 10/28/2023   NA 140 10/28/2023   K 4.0 10/28/2023   CL 105 10/28/2023   CREATININE 0.75 10/28/2023   BUN 20 10/28/2023   CO2 28 10/28/2023   TSH 0.75 10/28/2023   INR 1.1 (H) 09/17/2013   HGBA1C 6.2 04/22/2023    MM 3D SCREENING MAMMOGRAM BILATERAL BREAST  Result Date: 08/28/2023 CLINICAL DATA:  Screening. EXAM: DIGITAL SCREENING BILATERAL MAMMOGRAM WITH TOMOSYNTHESIS AND CAD TECHNIQUE: Bilateral screening digital craniocaudal and mediolateral oblique mammograms were obtained. Bilateral screening digital breast tomosynthesis was performed. The images were evaluated with computer-aided detection. COMPARISON:  Previous exam(s). ACR Breast Density Category c: The breasts are heterogeneously dense, which may obscure small masses. FINDINGS: There are no findings suspicious for malignancy. IMPRESSION: No mammographic evidence of malignancy. A result letter of this screening mammogram will be mailed directly to the patient. RECOMMENDATION: Screening mammogram in one year. (Code:SM-B-01Y) BI-RADS CATEGORY  1: Negative. Electronically Signed   By: Sherian Rein M.D.   On: 08/28/2023 07:20    Assessment & Plan:   Problem List Items Addressed This Visit     Venous insufficiency of leg    On Trental      Well adult exam - Primary    We discussed age appropriate health related issues, including available/recomended screening tests and vaccinations. We discussed a need for adhering to healthy diet and exercise. Labs were ordered to be later reviewed . All questions were answered. PAP per GYN Colonoscopy - 2019, 2024 due 2027 (polyps) Shingrix discussed No fam h/o CAD Mammo q 12 mo Eye appt, Derm q 12 mo      Colon polyp    Dr Leonides Schanz Colonoscopy - 2019, 2024 due 2027          No orders of the defined types were placed in this encounter.     Follow-up: No follow-ups on file.  Sonda Primes, MD

## 2023-11-04 NOTE — Addendum Note (Signed)
Addended by: Delsa Grana R on: 11/04/2023 08:30 AM   Modules accepted: Orders

## 2023-11-04 NOTE — Assessment & Plan Note (Signed)
Dr Leonides Schanz Colonoscopy - 2019, 2024 due 2027

## 2023-11-04 NOTE — Assessment & Plan Note (Signed)
On Trental

## 2023-11-15 ENCOUNTER — Encounter: Payer: Self-pay | Admitting: Gastroenterology

## 2023-12-24 ENCOUNTER — Telehealth: Payer: BC Managed Care – PPO | Admitting: Physician Assistant

## 2023-12-24 DIAGNOSIS — J019 Acute sinusitis, unspecified: Secondary | ICD-10-CM

## 2023-12-24 DIAGNOSIS — B9789 Other viral agents as the cause of diseases classified elsewhere: Secondary | ICD-10-CM | POA: Diagnosis not present

## 2023-12-24 MED ORDER — IPRATROPIUM BROMIDE 0.03 % NA SOLN
2.0000 | Freq: Two times a day (BID) | NASAL | 0 refills | Status: DC
Start: 2023-12-24 — End: 2024-11-04

## 2023-12-24 NOTE — Progress Notes (Signed)

## 2023-12-24 NOTE — Progress Notes (Signed)
 I have spent 5 minutes in review of e-visit questionnaire, review and updating patient chart, medical decision making and response to patient.   Piedad Climes, PA-C

## 2023-12-28 ENCOUNTER — Ambulatory Visit
Admission: RE | Admit: 2023-12-28 | Discharge: 2023-12-28 | Disposition: A | Discharge status: Home / Self Care | Payer: BC Managed Care – PPO | Source: Ambulatory Visit | Attending: Internal Medicine | Admitting: Internal Medicine

## 2023-12-28 VITALS — BP 103/52 | HR 73 | Temp 98.0°F | Resp 17

## 2023-12-28 DIAGNOSIS — J069 Acute upper respiratory infection, unspecified: Secondary | ICD-10-CM | POA: Diagnosis not present

## 2023-12-28 MED ORDER — PROMETHAZINE-DM 6.25-15 MG/5ML PO SYRP: 5.0000 mL | ORAL_SOLUTION | Freq: Every evening | ORAL | 0 refills | Status: DC | PRN

## 2023-12-28 MED ORDER — ALBUTEROL SULFATE HFA 108 (90 BASE) MCG/ACT IN AERS: 1.0000 | INHALATION_SPRAY | Freq: Four times a day (QID) | RESPIRATORY_TRACT | 0 refills | Status: DC | PRN

## 2023-12-28 NOTE — ED Triage Notes (Addendum)
Pt c/o of congestion, cough, and mucous that is worse at night. Symptoms have been going on for 9 days

## 2023-12-28 NOTE — Discharge Instructions (Signed)
You have a viral illness which will improve on its own with rest, fluids, and medications to help with your symptoms.  Tylenol, guaifenesin (plain mucinex), and saline nasal sprays may help relieve symptoms.  Promethazine DM at bedtime as needed for cough. Albuterol inhaler as needed for cough, shortness of breath.   Two teaspoons of honey in 1 cup of warm water every 4-6 hours may help with throat pains.  Humidifier in room at nighttime may help soothe cough (clean well daily).   For chest pain, shortness of breath, inability to keep food or fluids down without vomiting, fever that does not respond to tylenol or motrin, or any other severe symptoms, please go to the ER for further evaluation. Return to urgent care as needed, otherwise follow-up with PCP.
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
## 2024-03-31 ENCOUNTER — Other Ambulatory Visit: Payer: Self-pay | Admitting: Obstetrics and Gynecology

## 2024-03-31 ENCOUNTER — Encounter: Payer: Self-pay | Admitting: Obstetrics and Gynecology

## 2024-03-31 DIAGNOSIS — Z1231 Encounter for screening mammogram for malignant neoplasm of breast: Secondary | ICD-10-CM

## 2024-08-27 ENCOUNTER — Other Ambulatory Visit: Payer: Self-pay | Admitting: Internal Medicine

## 2024-08-27 ENCOUNTER — Ambulatory Visit
Admission: RE | Admit: 2024-08-27 | Discharge: 2024-08-27 | Disposition: A | Discharge status: Home / Self Care | Source: Ambulatory Visit | Attending: Obstetrics and Gynecology | Admitting: Obstetrics and Gynecology

## 2024-08-27 DIAGNOSIS — Z1231 Encounter for screening mammogram for malignant neoplasm of breast: Secondary | ICD-10-CM

## 2024-09-04 ENCOUNTER — Telehealth: Payer: Self-pay | Admitting: Internal Medicine

## 2024-09-04 DIAGNOSIS — Z0000 Encounter for general adult medical examination without abnormal findings: Secondary | ICD-10-CM

## 2024-09-04 DIAGNOSIS — R42 Dizziness and giddiness: Secondary | ICD-10-CM

## 2024-09-04 NOTE — Telephone Encounter (Signed)
 Pt had CPE scheduled 11/26 and has scheduled a lab visit for 11/19.  Please enter lab orders.

## 2024-09-07 NOTE — Telephone Encounter (Signed)
 Done. Thanks.

## 2024-10-28 ENCOUNTER — Other Ambulatory Visit

## 2024-10-28 DIAGNOSIS — Z0000 Encounter for general adult medical examination without abnormal findings: Secondary | ICD-10-CM

## 2024-10-28 DIAGNOSIS — R42 Dizziness and giddiness: Secondary | ICD-10-CM | POA: Diagnosis not present

## 2024-10-28 LAB — URINALYSIS, ROUTINE W REFLEX MICROSCOPIC
Bilirubin Urine: NEGATIVE
Hgb urine dipstick: NEGATIVE
Ketones, ur: NEGATIVE
Nitrite: NEGATIVE
RBC / HPF: NONE SEEN (ref 0–?)
Specific Gravity, Urine: 1.015 (ref 1.000–1.030)
Total Protein, Urine: NEGATIVE
Urine Glucose: NEGATIVE
Urobilinogen, UA: 0.2 (ref 0.0–1.0)
pH: 6 (ref 5.0–8.0)

## 2024-10-28 LAB — COMPREHENSIVE METABOLIC PANEL WITH GFR
ALT: 11 U/L (ref 0–35)
AST: 16 U/L (ref 0–37)
Albumin: 4.4 g/dL (ref 3.5–5.2)
Alkaline Phosphatase: 61 U/L (ref 39–117)
BUN: 24 mg/dL — ABNORMAL HIGH (ref 6–23)
CO2: 29 meq/L (ref 19–32)
Calcium: 9.4 mg/dL (ref 8.4–10.5)
Chloride: 102 meq/L (ref 96–112)
Creatinine, Ser: 0.89 mg/dL (ref 0.40–1.20)
GFR: 71.22 mL/min (ref >60.00)
Glucose, Bld: 102 mg/dL — ABNORMAL HIGH (ref 70–99)
Potassium: 4.1 meq/L (ref 3.5–5.1)
Sodium: 139 meq/L (ref 135–145)
Total Bilirubin: 0.6 mg/dL (ref 0.2–1.2)
Total Protein: 6.8 g/dL (ref 6.0–8.3)

## 2024-10-28 LAB — CBC WITH DIFFERENTIAL/PLATELET
Basophils Absolute: 0 K/uL (ref 0.0–0.1)
Basophils Relative: 0.6 % (ref 0.0–3.0)
Eosinophils Absolute: 0.3 K/uL (ref 0.0–0.7)
Eosinophils Relative: 6.5 % — ABNORMAL HIGH (ref 0.0–5.0)
HCT: 38.2 % (ref 36.0–46.0)
Hemoglobin: 12.8 g/dL (ref 12.0–15.0)
Lymphocytes Relative: 33.3 % (ref 12.0–46.0)
Lymphs Abs: 1.3 K/uL (ref 0.7–4.0)
MCHC: 33.4 g/dL (ref 30.0–36.0)
MCV: 94.4 fl (ref 78.0–100.0)
Monocytes Absolute: 0.4 K/uL (ref 0.1–1.0)
Monocytes Relative: 10.5 % (ref 3.0–12.0)
Neutro Abs: 2 K/uL (ref 1.4–7.7)
Neutrophils Relative %: 49.1 % (ref 43.0–77.0)
Platelets: 248 K/uL (ref 150.0–400.0)
RBC: 4.05 Mil/uL (ref 3.87–5.11)
RDW: 13.2 % (ref 11.5–15.5)
WBC: 4 K/uL (ref 4.0–10.5)

## 2024-10-28 LAB — LIPID PANEL
Cholesterol: 169 mg/dL (ref 0–200)
HDL: 75.2 mg/dL (ref >39.00)
LDL Cholesterol: 84 mg/dL (ref 0–99)
NonHDL: 93.9
Total CHOL/HDL Ratio: 2
Triglycerides: 49 mg/dL (ref 0.0–149.0)
VLDL: 9.8 mg/dL (ref 0.0–40.0)

## 2024-10-28 LAB — TSH: TSH: 1.39 u[IU]/mL (ref 0.35–5.50)

## 2024-10-28 LAB — T4, FREE: Free T4: 0.73 ng/dL (ref 0.60–1.60)

## 2024-11-04 ENCOUNTER — Ambulatory Visit: Payer: BC Managed Care – PPO | Admitting: Internal Medicine

## 2024-11-04 ENCOUNTER — Encounter: Payer: Self-pay | Admitting: Internal Medicine

## 2024-11-04 VITALS — BP 102/68 | HR 70 | Temp 98.1°F | Ht 64.0 in | Wt 125.8 lb

## 2024-11-04 DIAGNOSIS — Z0000 Encounter for general adult medical examination without abnormal findings: Secondary | ICD-10-CM

## 2024-11-04 DIAGNOSIS — R739 Hyperglycemia, unspecified: Secondary | ICD-10-CM | POA: Diagnosis not present

## 2024-11-04 DIAGNOSIS — Z23 Encounter for immunization: Secondary | ICD-10-CM

## 2024-11-04 DIAGNOSIS — D682 Hereditary deficiency of other clotting factors: Secondary | ICD-10-CM | POA: Diagnosis not present

## 2024-11-04 DIAGNOSIS — E559 Vitamin D deficiency, unspecified: Secondary | ICD-10-CM

## 2024-11-04 NOTE — Progress Notes (Signed)
 Subjective:  Patient ID: Jody Ford, female    DOB: Dec 09, 1966  Age: 58 y.o. MRN: [id number]  CC: Annual Exam (Annual Exam )   HPI LAVADA LANGSAM presents for a well exam  Outpatient Medications Prior to Visit  Medication Sig Dispense Refill   aspirin  81 MG tablet Take 81 mg by mouth daily.     Calcium-Magnesium-Vitamin D  (CITRACAL CALCIUM +D3) 600-40-500 MG-MG-UNIT TB24      Multiple Vitamin (MULTIVITAMIN) tablet Take 1 tablet by mouth daily.     Na Sulfate-K Sulfate-Mg Sulf 17.5-3.13-1.6 GM/177ML SOLN SMARTSIG:1 Kit(s) By Mouth Once     pentoxifylline  (TRENTAL ) 400 MG CR tablet TAKE 1 TABLET (400 MG TOTAL) BY MOUTH 3 (THREE) TIMES DAILY. TAKE 1 BY MOUTH 3 (THREE) TIMES DAILY. 270 tablet 3   venlafaxine  XR (EFFEXOR -XR) 37.5 MG 24 hr capsule Take 1 capsule (37.5 mg total) by mouth daily. 90 capsule 3   albuterol  (VENTOLIN  HFA) 108 (90 Base) MCG/ACT inhaler Inhale 1-2 puffs into the lungs every 6 (six) hours as needed for wheezing or shortness of breath. 8 g 0   ALPRAZolam  (XANAX ) 0.25 MG tablet Take 1 tablet (0.25 mg total) by mouth 2 (two) times daily as needed for anxiety. (Patient not taking: Reported on 11/04/2024) 60 tablet 2   ipratropium (ATROVENT ) 0.03 % nasal spray Place 2 sprays into both nostrils every 12 (twelve) hours. 30 mL 0   promethazine -dextromethorphan (PROMETHAZINE -DM) 6.25-15 MG/5ML syrup Take 5 mLs by mouth at bedtime as needed for cough. 118 mL 0   No facility-administered medications prior to visit.    ROS: Review of Systems  Constitutional:  Negative for activity change, appetite change, chills, fatigue and unexpected weight change.  HENT:  Negative for congestion, mouth sores and sinus pressure.   Eyes:  Negative for visual disturbance.  Respiratory:  Negative for cough and chest tightness.   Gastrointestinal:  Negative for abdominal pain and nausea.  Genitourinary:  Negative for difficulty urinating, frequency and vaginal pain.   Musculoskeletal:  Negative for back pain and gait problem.  Skin:  Negative for pallor and rash.  Neurological:  Negative for dizziness, tremors, weakness, numbness and headaches.  Psychiatric/Behavioral:  Negative for confusion, sleep disturbance and suicidal ideas.     Objective:  BP 102/68   Pulse 70   Temp 98.1 F (36.7 C)   Ht 5' 4 (1.626 m)   Wt 125 lb 12.8 oz (57.1 kg)   SpO2 99%   BMI 21.59 kg/m   BP Readings from Last 3 Encounters:  11/04/24 102/68  12/28/23 (!) 103/52  11/04/23 112/68    Wt Readings from Last 3 Encounters:  11/04/24 125 lb 12.8 oz (57.1 kg)  11/04/23 130 lb (59 kg)  10/23/23 130 lb (59 kg)    Physical Exam Constitutional:      General: She is not in acute distress.    Appearance: Normal appearance. She is well-developed.  HENT:     Head: Normocephalic.     Right Ear: External ear normal.     Left Ear: External ear normal.     Nose: Nose normal.  Eyes:     General:        Right eye: No discharge.        Left eye: No discharge.     Conjunctiva/sclera: Conjunctivae normal.     Pupils: Pupils are equal, round, and reactive to light.  Neck:     Thyroid : No thyromegaly.     Vascular: No JVD.  Trachea: No tracheal deviation.  Cardiovascular:     Rate and Rhythm: Normal rate and regular rhythm.     Heart sounds: Normal heart sounds.  Pulmonary:     Effort: No respiratory distress.     Breath sounds: No stridor. No wheezing.  Abdominal:     General: Bowel sounds are normal. There is no distension.     Palpations: Abdomen is soft. There is no mass.     Tenderness: There is no abdominal tenderness. There is no guarding or rebound.  Musculoskeletal:        General: No tenderness.     Cervical back: Normal range of motion and neck supple. No rigidity.  Lymphadenopathy:     Cervical: No cervical adenopathy.  Skin:    Findings: No erythema or rash.  Neurological:     Cranial Nerves: No cranial nerve deficit.     Motor: No abnormal  muscle tone.     Coordination: Coordination normal.     Deep Tendon Reflexes: Reflexes normal.  Psychiatric:        Behavior: Behavior normal.        Thought Content: Thought content normal.        Judgment: Judgment normal.     Lab Results  Component Value Date   WBC 4.0 10/28/2024   HGB 12.8 10/28/2024   HCT 38.2 10/28/2024   PLT 248.0 10/28/2024   GLUCOSE 102 (H) 10/28/2024   CHOL 169 10/28/2024   TRIG 49.0 10/28/2024   HDL 75.20 10/28/2024   LDLCALC 84 10/28/2024   ALT 11 10/28/2024   AST 16 10/28/2024   NA 139 10/28/2024   K 4.1 10/28/2024   CL 102 10/28/2024   CREATININE 0.89 10/28/2024   BUN 24 (H) 10/28/2024   CO2 29 10/28/2024   TSH 1.39 10/28/2024   INR 1.1 (H) 09/17/2013   HGBA1C 6.2 04/22/2023    MM 3D SCREENING MAMMOGRAM BILATERAL BREAST Result Date: 08/31/2024 CLINICAL DATA:  Screening. EXAM: DIGITAL SCREENING BILATERAL MAMMOGRAM WITH TOMOSYNTHESIS AND CAD TECHNIQUE: Bilateral screening digital craniocaudal and mediolateral oblique mammograms were obtained. Bilateral screening digital breast tomosynthesis was performed. The images were evaluated with computer-aided detection. COMPARISON:  Previous exam(s). ACR Breast Density Category c: The breasts are heterogeneously dense, which may obscure small masses. FINDINGS: There are no findings suspicious for malignancy. IMPRESSION: No mammographic evidence of malignancy. A result letter of this screening mammogram will be mailed directly to the patient. RECOMMENDATION: Screening mammogram in one year. (Code:SM-B-01Y) BI-RADS CATEGORY  1: Negative. Electronically Signed   By: Dina  Arceo M.D.   On: 08/31/2024 06:25    Assessment & Plan:   Problem List Items Addressed This Visit     Factor V deficiency (HCC)   ASA 81 mg/d Compr socks and 2 ASA for trips      Hyperglycemia - Primary   Very mild On a good diet, walking      Vitamin D  deficiency   On a MVI, baby ASA, calcium w/D      Well adult exam   We  discussed age appropriate health related issues, including available/recomended screening tests and vaccinations. We discussed a need for adhering to healthy diet and exercise. Labs were ordered to be later reviewed . All questions were answered. PAP per GYN Colonoscopy - 2019, 2024 due 2027 (polyps) Shingrix discussed No fam h/o CAD Mammo q 12 mo Eye appt q 12 mo         No orders of the defined types were  placed in this encounter.     Follow-up: Return in about 1 year (around 11/04/2025) for Wellness Exam.  Marolyn Noel, MD

## 2024-11-04 NOTE — Assessment & Plan Note (Signed)
 Very mild On a good diet, walking

## 2024-11-04 NOTE — Assessment & Plan Note (Signed)
ASA 81 mg/d Compr socks and 2 ASA for trips

## 2024-11-04 NOTE — Assessment & Plan Note (Signed)
 On a MVI, baby ASA, calcium w/D

## 2024-11-04 NOTE — Assessment & Plan Note (Signed)
 We discussed age appropriate health related issues, including available/recomended screening tests and vaccinations. We discussed a need for adhering to healthy diet and exercise. Labs were ordered to be later reviewed . All questions were answered. PAP per GYN Colonoscopy - 2019, 2024 due 2027 (polyps) Shingrix discussed No fam h/o CAD Mammo q 12 mo Eye appt q 12 mo

## 2024-11-04 NOTE — Addendum Note (Signed)
 Addended byBETHA LUCETTA CLEATRICE LELON on: 11/04/2024 08:22 AM   Modules accepted: Orders

## 2025-11-08 ENCOUNTER — Encounter: Admitting: Internal Medicine
